# Patient Record
Sex: Male | Born: 1990 | Race: White | Hispanic: No | Marital: Single | Smoking: Never smoker
Health system: Southern US, Community
[De-identification: ages and names within clinical notes are randomized; demographics above are authoritative.]

## PROBLEM LIST (undated history)

## (undated) DIAGNOSIS — N200 Calculus of kidney: Secondary | ICD-10-CM

## (undated) DIAGNOSIS — J45909 Unspecified asthma, uncomplicated: Secondary | ICD-10-CM

## (undated) DIAGNOSIS — T63441A Toxic effect of venom of bees, accidental (unintentional), initial encounter: Secondary | ICD-10-CM

## (undated) DIAGNOSIS — T7840XA Allergy, unspecified, initial encounter: Secondary | ICD-10-CM

## (undated) HISTORY — DX: Unspecified asthma, uncomplicated: J45.909

## (undated) HISTORY — DX: Allergy, unspecified, initial encounter: T78.40XA

## (undated) HISTORY — DX: Calculus of kidney: N20.0

## (undated) HISTORY — DX: Toxic effect of venom of bees, accidental (unintentional), initial encounter: T63.441A

---
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
## 2013-05-01 ENCOUNTER — Emergency Department: Payer: Self-pay | Admitting: Emergency Medicine

## 2014-07-08 ENCOUNTER — Ambulatory Visit: Payer: Self-pay | Admitting: Nurse Practitioner

## 2014-08-03 ENCOUNTER — Encounter: Payer: Self-pay | Admitting: Nurse Practitioner

## 2014-08-03 ENCOUNTER — Ambulatory Visit (INDEPENDENT_AMBULATORY_CARE_PROVIDER_SITE_OTHER): Payer: BLUE CROSS/BLUE SHIELD | Admitting: Nurse Practitioner

## 2014-08-03 ENCOUNTER — Encounter (INDEPENDENT_AMBULATORY_CARE_PROVIDER_SITE_OTHER): Payer: Self-pay

## 2014-08-03 VITALS — BP 104/64 | HR 74 | Temp 97.5°F | Resp 18 | Ht 74.0 in | Wt 157.6 lb

## 2014-08-03 DIAGNOSIS — Z7189 Other specified counseling: Secondary | ICD-10-CM | POA: Diagnosis not present

## 2014-08-03 DIAGNOSIS — Z91048 Other nonmedicinal substance allergy status: Secondary | ICD-10-CM

## 2014-08-03 DIAGNOSIS — Z8709 Personal history of other diseases of the respiratory system: Secondary | ICD-10-CM

## 2014-08-03 DIAGNOSIS — G4700 Insomnia, unspecified: Secondary | ICD-10-CM | POA: Diagnosis not present

## 2014-08-03 DIAGNOSIS — Z9109 Other allergy status, other than to drugs and biological substances: Secondary | ICD-10-CM

## 2014-08-03 DIAGNOSIS — Z7689 Persons encountering health services in other specified circumstances: Secondary | ICD-10-CM

## 2014-08-03 MED ORDER — MOMETASONE FUROATE 50 MCG/ACT NA SUSP: 2.0000 | Freq: Every day | NASAL | Status: DC

## 2014-08-03 NOTE — Patient Instructions (Addendum)
Allergies:   Nasonex-Called into pharmacy [facility]- Allegra/Claritin/Zyrtec (can use generic) to take daily for symptoms Sudafed PE- for decongestant   Insomnia- Try melatonin consistently for a few days to see if it is helpful   Tylenol PM- as needed for sleep  Insomnia Insomnia is frequent trouble falling and/or staying asleep. Insomnia can be a long term problem or a short term problem. Both are common. Insomnia can be a short term problem when the wakefulness is related to a certain stress or worry. Long term insomnia is often related to ongoing stress during waking hours and/or poor sleeping habits. Overtime, sleep deprivation itself can make the problem worse. Every little thing feels more severe because you are overtired and your ability to cope is decreased. CAUSES   Stress, anxiety, and depression.  Poor sleeping habits.  Distractions such as TV in the bedroom.  Naps close to bedtime.  Engaging in emotionally charged conversations before bed.  Technical reading before sleep.  Alcohol and other sedatives. They may make the problem worse. They can hurt normal sleep patterns and normal dream activity.  Stimulants such as caffeine for several hours prior to bedtime.  Pain syndromes and shortness of breath can cause insomnia.  Exercise late at night.  Changing time zones may cause sleeping problems (jet lag). It is sometimes helpful to have someone observe your sleeping patterns. They should look for periods of not breathing during the night (sleep apnea). They should also look to see how long those periods last. If you live alone or observers are uncertain, you can also be observed at a sleep clinic where your sleep patterns will be professionally monitored. Sleep apnea requires a checkup and treatment. Give your caregivers your medical history. Give your caregivers observations your family has made about your sleep.  SYMPTOMS   Not feeling rested in the morning.  Anxiety  and restlessness at bedtime.  Difficulty falling and staying asleep. TREATMENT   Your caregiver may prescribe treatment for an underlying medical disorders. Your caregiver can give advice or help if you are using alcohol or other drugs for self-medication. Treatment of underlying problems will usually eliminate insomnia problems.  Medications can be prescribed for short time use. They are generally not recommended for lengthy use.  Over-the-counter sleep medicines are not recommended for lengthy use. They can be habit forming.  You can promote easier sleeping by making lifestyle changes such as:  Using relaxation techniques that help with breathing and reduce muscle tension.  Exercising earlier in the day.  Changing your diet and the time of your last meal. No night time snacks.  Establish a regular time to go to bed.  Counseling can help with stressful problems and worry.  Soothing music and white noise may be helpful if there are background noises you cannot remove.  Stop tedious detailed work at least one hour before bedtime. HOME CARE INSTRUCTIONS   Keep a diary. Inform your caregiver about your progress. This includes any medication side effects. See your caregiver regularly. Take note of:  Times when you are asleep.  Times when you are awake during the night.  The quality of your sleep.  How you feel the next day. This information will help your caregiver care for you.  Get out of bed if you are still awake after 15 minutes. Read or do some quiet activity. Keep the lights down. Wait until you feel sleepy and go back to bed.  Keep regular sleeping and waking hours. Avoid naps.  Exercise regularly.  Avoid distractions at bedtime. Distractions include watching television or engaging in any intense or detailed activity like attempting to balance the household checkbook.  Develop a bedtime ritual. Keep a familiar routine of bathing, brushing your teeth, climbing into  bed at the same time each night, listening to soothing music. Routines increase the success of falling to sleep faster.  Use relaxation techniques. This can be using breathing and muscle tension release routines. It can also include visualizing peaceful scenes. You can also help control troubling or intruding thoughts by keeping your mind occupied with boring or repetitive thoughts like the old concept of counting sheep. You can make it more creative like imagining planting one beautiful flower after another in your backyard garden.  During your day, work to eliminate stress. When this is not possible use some of the previous suggestions to help reduce the anxiety that accompanies stressful situations. MAKE SURE YOU:   Understand these instructions.  Will watch your condition.  Will get help right away if you are not doing well or get worse. Document Released: 01/21/2000 Document Revised: 04/17/2011 Document Reviewed: 02/20/2007 [facility] Patient Information 2015 [place], [place]. This information is not intended to replace advice given to you by your health care provider. Make sure you discuss any questions you have with your health care provider.

## 2014-08-03 NOTE — Progress Notes (Signed)
Subjective:    Patient ID: Jeffery Cruz, male    DOB: [birth year], 24 y.o.   MRN: [id number]  HPI  Jeffery Cruz is a 24 yo male establishing care and CCs of insomnia and allergies.   1) Health Maintenance-   Diet- Eats out often, dinner at home, not watching anything   Exercise- Active job, no formal exercise   Immunizations- Unknown  Eye Exam- Needs   Dental Exam- UTD  2) Chronic Problems-  Severe allergies- See below  Asthma- Childhood, no inhaler used since then  3) Acute Problems-  Insomnia- x 1-2 years, Grandmother pointed it out- tossed and turned while on vacation, wakes up at 4 am, watches tv before bed, feels physically tired, hard to turn off mind. 20-30 min from lying down to sleep. Sleeps for 4 hours then awake for 20-30 min. Wakes up feeling tired.   Melatonin- somewhat helpful  Diversion of mind with reading    Allergies- Trouble breathing out of nose, progressing over the summer, taste and smell are decreased since summer time started,   Z-pack- took full five days (brother's prescription), helpful, Took it 2 months ago.    No allergy medications   Nasal spray- unsure of what it is.   Review of Systems  Constitutional: Negative for fever, chills, diaphoresis and fatigue.  HENT: Positive for congestion, nosebleeds, rhinorrhea, sinus pressure and sneezing. Negative for ear discharge, ear pain, postnasal drip and sore throat.        Nosebleeds- 2 x in last month  Eyes: Positive for discharge and itching. Negative for photophobia and visual disturbance.  Respiratory: Negative for cough, chest tightness, shortness of breath and wheezing.   Cardiovascular: Negative for chest pain, palpitations and leg swelling.  Gastrointestinal: Negative for nausea, vomiting, diarrhea and constipation.  Genitourinary: Negative for difficulty urinating.  Musculoskeletal: Negative for myalgias, joint swelling, gait problem and neck stiffness.  Skin: Negative for rash.    Neurological: Positive for headaches. Negative for dizziness, weakness and numbness.  Psychiatric/Behavioral: Positive for sleep disturbance. Negative for suicidal ideas. The patient is not nervous/anxious.    Past Medical History  Diagnosis Date  . Allergy   . Asthma     History   Social History  . Marital Status: Single    Spouse Name: N/A  . Number of Children: N/A  . Years of Education: N/A   Occupational History  . Not on file.   Social History Main Topics  . Smoking status: Never Smoker   . Smokeless tobacco: Not on file  . Alcohol Use: No  . Drug Use: No  . Sexual Activity:    Partners: Female     Comment: 1 partner    Other Topics Concern  . Not on file   Social History Narrative   Work- [employer]    Lives by himself    No children    Left handed    Caffeine- None   Enjoys- Works around his house    Highest level of education- high school diploma     Past Surgical History  Procedure Laterality Date  . Wisdom tooth extraction Bilateral 2013     Family History  Problem Relation Age of Onset  . Allergies Father   . Allergies Sister   . Arthritis Maternal Grandmother   . Arthritis Paternal Grandmother     No Known Allergies  No current outpatient prescriptions on file prior to visit.   No current facility-administered medications on file prior  to visit.      Objective:   Physical Exam  Constitutional: He is oriented to person, place, and time. He appears well-developed and well-nourished. No distress.  BP 104/64 mmHg  Pulse 74  Temp(Src) 97.5 F (36.4 C)  Resp 18  Ht 6\' 2"  (1.88 m)  Wt 157 lb 9.6 oz (71.487 kg)  BMI 20.23 kg/m2  SpO2 97%   HENT:  Head: Normocephalic and atraumatic.  Right Ear: External ear normal.  Left Ear: External ear normal.  Cardiovascular: Normal rate, regular rhythm and normal heart sounds.  Exam reveals no gallop and no friction rub.   No murmur heard. Pulmonary/Chest: Effort normal  and breath sounds normal. No respiratory distress. He has no wheezes. He has no rales. He exhibits no tenderness.  Neurological: He is alert and oriented to person, place, and time.  Skin: Skin is warm and dry. No rash noted. He is not diaphoretic.  Psychiatric: He has a normal mood and affect. His behavior is normal. Judgment and thought content normal.       Assessment & Plan:

## 2014-08-11 DIAGNOSIS — Z9109 Other allergy status, other than to drugs and biological substances: Secondary | ICD-10-CM | POA: Insufficient documentation

## 2014-08-11 DIAGNOSIS — G4700 Insomnia, unspecified: Secondary | ICD-10-CM | POA: Insufficient documentation

## 2014-08-11 DIAGNOSIS — Z7689 Persons encountering health services in other specified circumstances: Secondary | ICD-10-CM | POA: Insufficient documentation

## 2014-08-11 DIAGNOSIS — Z8709 Personal history of other diseases of the respiratory system: Secondary | ICD-10-CM | POA: Insufficient documentation

## 2014-08-11 NOTE — Assessment & Plan Note (Signed)
Discussed acute and chronic issues. Reviewed health maintenance measures, PFSHx, and immunizations. Obtain records from previous facilities.

## 2014-08-11 NOTE — Assessment & Plan Note (Addendum)
1-2 years worsening recently. Pt using some sleep hygiene techniques and OTC medications such as melatonin. Not taking consistently. Asked him to try it consistently for a few weeks. FU 4 weeks

## 2014-08-11 NOTE — Assessment & Plan Note (Addendum)
Called in refills of nasonex. Asked pt to start Allegra/Claritin/or Zyrtec (pick one). FU in 4 wks

## 2014-08-11 NOTE — Assessment & Plan Note (Signed)
No problems since childhood, no inhaler, currently stable

## 2014-08-31 ENCOUNTER — Ambulatory Visit (INDEPENDENT_AMBULATORY_CARE_PROVIDER_SITE_OTHER): Payer: BLUE CROSS/BLUE SHIELD | Admitting: Nurse Practitioner

## 2014-08-31 VITALS — BP 98/76 | HR 72 | Temp 98.0°F | Resp 18 | Ht 74.0 in | Wt 159.6 lb

## 2014-08-31 DIAGNOSIS — M25512 Pain in left shoulder: Secondary | ICD-10-CM

## 2014-08-31 DIAGNOSIS — G4700 Insomnia, unspecified: Secondary | ICD-10-CM

## 2014-08-31 DIAGNOSIS — Z9109 Other allergy status, other than to drugs and biological substances: Secondary | ICD-10-CM

## 2014-08-31 DIAGNOSIS — Z91048 Other nonmedicinal substance allergy status: Secondary | ICD-10-CM

## 2014-08-31 NOTE — Patient Instructions (Signed)
Try a tennis ball between your shoulders and roll it between you and the wall.  Muscle relaxer as needed (only at bedtime)  Ice and ibuprofen are helpful as well.   Melatonin is over the counter and you can find anywhere there are vitamins.

## 2014-08-31 NOTE — Progress Notes (Signed)
   Subjective:    Patient ID: Jeffery Cruz, male    DOB: Nov 22, 1990, 24 y.o.   MRN: [id number]  HPI  Mr. Jeffery Cruz is a 24 yo male with a follow up of insomnia and allergies.   1) Melatonin- not taken, no problems going to sleep he reports, but wakes up 2-3 x and has trouble falling asleep. Not getting good quality sleep.   2) Allergies- Nasonex and Zyrtec makes his nose sore, allergies have improved. It is helpful   3) L shoulder x 1 year and right x 1 month. Lifts furniture daily. Injured back last year- muscle strain and gave him muscle relaxer.    Review of Systems  Constitutional: Positive for fatigue. Negative for fever, chills and diaphoresis.  Eyes: Negative for visual disturbance.  Respiratory: Negative for cough, chest tightness, shortness of breath and wheezing.   Cardiovascular: Negative for chest pain, palpitations and leg swelling.  Allergic/Immunologic: Positive for environmental allergies.  Psychiatric/Behavioral: Positive for sleep disturbance. Negative for suicidal ideas. The patient is not nervous/anxious.       Objective:   Physical Exam  Constitutional: He is oriented to person, place, and time. He appears well-developed and well-nourished. No distress.  BP 98/76 mmHg  Pulse 72  Temp(Src) 98 F (36.7 C)  Resp 18  Ht 6\' 2"  (1.88 m)  Wt 159 lb 9.6 oz (72.394 kg)  BMI 20.48 kg/m2  SpO2 97%   HENT:  Head: Normocephalic and atraumatic.  Right Ear: External ear normal.  Left Ear: External ear normal.  Cardiovascular: Normal rate, regular rhythm and normal heart sounds.   Pulmonary/Chest: Effort normal and breath sounds normal. No respiratory distress. He has no wheezes. He has no rales. He exhibits no tenderness.  Musculoskeletal: Normal range of motion. He exhibits tenderness. He exhibits no edema.  Neurological: He is alert and oriented to person, place, and time.  Skin: Skin is warm and dry. No rash noted. He is not diaphoretic.  Psychiatric: He has a  normal mood and affect. His behavior is normal. Judgment and thought content normal.      Assessment & Plan:

## 2014-08-31 NOTE — Progress Notes (Signed)
Pre visit review using our clinic review tool, if applicable. No additional management support is needed unless otherwise documented below in the visit note. 

## 2014-09-09 ENCOUNTER — Encounter: Payer: Self-pay | Admitting: Nurse Practitioner

## 2014-09-09 DIAGNOSIS — M25512 Pain in left shoulder: Secondary | ICD-10-CM | POA: Insufficient documentation

## 2014-09-09 NOTE — Assessment & Plan Note (Signed)
Encouraged pt to try cross over technique instead of spraying straight up nostrils. FU prn worsening/failure to improve.

## 2014-09-09 NOTE — Assessment & Plan Note (Signed)
Pt has been non-compliant on the melatonin. He reports same symptoms, but was unsure if it was prescription or OTC (advised pt to call if he has questions like this instead of waiting). OTC melatonin consistently. Advised of sleep hygiene pointers.

## 2014-09-09 NOTE — Assessment & Plan Note (Signed)
Worsening. Pt has leftover muscle relaxer at home and will use. Described stretches for pt to perform, ice, rest, and trying a tennis ball between shoulder blades and rolling it against a wall and his back. FU prn worsening/failure to improve.

## 2014-10-10 ENCOUNTER — Encounter: Payer: Self-pay | Admitting: Emergency Medicine

## 2014-10-10 ENCOUNTER — Emergency Department
Admission: EM | Admit: 2014-10-10 | Discharge: 2014-10-10 | Disposition: A | Discharge status: Home / Self Care | Payer: BLUE CROSS/BLUE SHIELD | Attending: Emergency Medicine | Admitting: Emergency Medicine

## 2014-10-10 DIAGNOSIS — S76211A Strain of adductor muscle, fascia and tendon of right thigh, initial encounter: Secondary | ICD-10-CM | POA: Diagnosis not present

## 2014-10-10 DIAGNOSIS — Y998 Other external cause status: Secondary | ICD-10-CM | POA: Diagnosis not present

## 2014-10-10 DIAGNOSIS — S76219A Strain of adductor muscle, fascia and tendon of unspecified thigh, initial encounter: Secondary | ICD-10-CM

## 2014-10-10 DIAGNOSIS — Y9289 Other specified places as the place of occurrence of the external cause: Secondary | ICD-10-CM | POA: Insufficient documentation

## 2014-10-10 DIAGNOSIS — Y9389 Activity, other specified: Secondary | ICD-10-CM | POA: Insufficient documentation

## 2014-10-10 DIAGNOSIS — S3991XA Unspecified injury of abdomen, initial encounter: Secondary | ICD-10-CM | POA: Diagnosis present

## 2014-10-10 DIAGNOSIS — X58XXXA Exposure to other specified factors, initial encounter: Secondary | ICD-10-CM | POA: Diagnosis not present

## 2014-10-10 LAB — URINALYSIS COMPLETE WITH MICROSCOPIC (ARMC ONLY)
BACTERIA UA: NONE SEEN
Bilirubin Urine: NEGATIVE
GLUCOSE, UA: NEGATIVE mg/dL
HGB URINE DIPSTICK: NEGATIVE
Ketones, ur: NEGATIVE mg/dL
LEUKOCYTES UA: NEGATIVE
Nitrite: NEGATIVE
PH: 7 (ref 5.0–8.0)
PROTEIN: NEGATIVE mg/dL
SQUAMOUS EPITHELIAL / LPF: NONE SEEN
Specific Gravity, Urine: 1.01 (ref 1.005–1.030)

## 2014-10-10 LAB — CBC
HEMATOCRIT: 41.8 % (ref 40.0–52.0)
Hemoglobin: 14.4 g/dL (ref 13.0–18.0)
MCH: 31.1 pg (ref 26.0–34.0)
MCHC: 34.5 g/dL (ref 32.0–36.0)
MCV: 90.3 fL (ref 80.0–100.0)
Platelets: 242 10*3/uL (ref 150–440)
RBC: 4.63 MIL/uL (ref 4.40–5.90)
RDW: 12.6 % (ref 11.5–14.5)
WBC: 7 10*3/uL (ref 3.8–10.6)

## 2014-10-10 LAB — COMPREHENSIVE METABOLIC PANEL
ALBUMIN: 4.6 g/dL (ref 3.5–5.0)
ALT: 17 U/L (ref 17–63)
AST: 24 U/L (ref 15–41)
Alkaline Phosphatase: 61 U/L (ref 38–126)
Anion gap: 8 (ref 5–15)
BUN: 13 mg/dL (ref 6–20)
CHLORIDE: 102 mmol/L (ref 101–111)
CO2: 29 mmol/L (ref 22–32)
Calcium: 9.1 mg/dL (ref 8.9–10.3)
Creatinine, Ser: 0.89 mg/dL (ref 0.61–1.24)
GFR calc Af Amer: >60 mL/min (ref >60)
GLUCOSE: 113 mg/dL — AB (ref 65–99)
POTASSIUM: 3.6 mmol/L (ref 3.5–5.1)
SODIUM: 139 mmol/L (ref 135–145)
Total Bilirubin: 1.1 mg/dL (ref 0.3–1.2)
Total Protein: 7 g/dL (ref 6.5–8.1)

## 2014-10-10 LAB — LIPASE, BLOOD: LIPASE: 23 U/L (ref 22–51)

## 2014-10-10 MED ORDER — IBUPROFEN 800 MG PO TABS: 800.0000 mg | ORAL_TABLET | Freq: Three times a day (TID) | ORAL | Status: DC | PRN

## 2014-10-10 MED ORDER — IBUPROFEN 800 MG PO TABS
800.0000 mg | ORAL_TABLET | Freq: Once | ORAL | Status: AC
  Administered 2014-10-10: 800 mg via ORAL
  Filled 2014-10-10: qty 1

## 2014-10-10 MED ORDER — DIAZEPAM 5 MG PO TABS: 5.0000 mg | ORAL_TABLET | Freq: Three times a day (TID) | ORAL | Status: DC | PRN

## 2014-10-10 MED ORDER — DIAZEPAM 5 MG PO TABS
10.0000 mg | ORAL_TABLET | Freq: Once | ORAL | Status: AC
  Administered 2014-10-10: 10 mg via ORAL
  Filled 2014-10-10: qty 2

## 2014-10-10 NOTE — ED Notes (Signed)
Pt reports right lower quadrant pain x2-3 days ago; pt denies nausea or vomiting, reports diarrhea x2 days. Pt denies blood.

## 2014-10-10 NOTE — Discharge Instructions (Signed)
Groin Strain °A groin strain (also called a groin pull) is an injury to the muscles or tendon on the upper inner part of the thigh. These muscles are called the adductor muscles or groin muscles. They are responsible for moving the leg across the body. A muscle strain occurs when a muscle is overstretched and some muscle fibers are torn. A groin strain can range from mild to severe depending on how many muscle fibers are affected and whether the muscle fibers are partially or completely torn.  °Groin strains usually occur during exercise or participation in sports. The injury often happens when a sudden, violent force is placed on a muscle, stretching the muscle too far. A strain is more likely to occur when your muscles are not warmed up or if you are not properly conditioned. Depending on the severity of the groin strain, recovery time may vary from a few weeks to several weeks. Severe injuries often require 4-6 weeks for recovery. In these cases, complete healing can take 4-5 months.  °CAUSES  °· Stretching the groin muscles too far or too suddenly, often during side-to-side motion with an abrupt change in direction. °· Putting repeated stress on the groin muscles over a long period of time. °· Performing vigorous activity without properly stretching the groin muscles beforehand. °SYMPTOMS  °· Pain and tenderness in the groin area. This begins as sharp pain and persists as a dull ache. °· Popping or snapping feeling when the injury occurs (for severe strains). °· Swelling or bruising. °· Muscle spasms. °· Weakness in the leg. °· Stiffness in the groin area with decreased ability to move the affected muscles. °DIAGNOSIS  °Your caregiver will perform a physical exam to diagnose a groin strain. You will be asked about your symptoms and how the injury occurred. X-rays are sometimes needed to rule out a broken bone or cartilage problems. Your caregiver may order a CT scan or MRI if a complete muscle tear is  suspected. °TREATMENT  °A groin strain will often heal on its own. Your caregiver may prescribe medicines to help manage pain and swelling (anti-inflammatory medicine). You may be told to use crutches for the first few days to minimize your pain. °HOME CARE INSTRUCTIONS  °· Rest. Do not use the strained muscle if it causes pain. °· Put ice on the injured area. °¨ Put ice in a plastic bag. °¨ Place a towel between your skin and the bag. °¨ Leave the ice on for 15-20 minutes, every 2-3 hours. Do this for the first 2 days after the injury.  °· Only take over-the-counter or prescription medicines as directed by your caregiver. °· Wrap the injured area with an elastic bandage as directed by your caregiver. °· Keep the injured leg raised (elevated). °· Walk, stretch, and perform range-of-motion exercises to improve blood flow to the injured area. Only perform these activities if you can do so without any pain. °To prevent muscle strains: °· Warm up before exercise. °· Develop proper conditioning and strength in the groin muscles. °SEEK IMMEDIATE MEDICAL CARE IF:  °· You have increased pain or swelling in the affected area.   °· Your symptoms are not improving or are getting worse. °MAKE SURE YOU:  °· Understand these instructions. °· Will watch your condition. °· Will get help right away if you are not doing well or get worse. °Document Released: 09/21/2003 Document Revised: 01/10/2012 Document Reviewed: 09/27/2011 °[employer]® Patient Information ©2015 [employer]. This information is not intended to replace advice given to you   by your health care provider. Make sure you discuss any questions you have with your health care provider. ° °

## 2014-10-10 NOTE — ED Provider Notes (Signed)
[facility] Emergency Department Provider Note     Time seen: ----------------------------------------- 9:46 PM on 10/10/2014 -----------------------------------------    I have reviewed the triage vital signs and the nursing notes.   HISTORY  Chief Complaint Abdominal Pain    HPI Jeffery Cruz is a 24 y.o. male who presents to ER with right lower quadrant pain for the last 2-3 days. Patient denies any nausea vomiting but has had some diarrhea for 2 days. Patient denies any fevers chills other complaints. Patient currently works Building surveyor.Moving the right leg seems to worsen his symptoms.   Past Medical History  Diagnosis Date  . Allergy   . Asthma     Patient Active Problem List   Diagnosis Date Noted  . Left shoulder pain 09/09/2014  . Encounter to establish care 08/11/2014  . Insomnia 08/11/2014  . H/O extrinsic asthma 08/11/2014  . Environmental allergies 08/11/2014    Past Surgical History  Procedure Laterality Date  . Wisdom tooth extraction Bilateral 2013     Allergies Review of patient's allergies indicates no known allergies.  Social History Social History  Substance Use Topics  . Smoking status: Never Smoker   . Smokeless tobacco: None  . Alcohol Use: No    Review of Systems Constitutional: Negative for fever. Eyes: Negative for visual changes. ENT: Negative for sore throat. Cardiovascular: Negative for chest pain. Respiratory: Negative for shortness of breath. Gastrointestinal: Negative for abdominal pain, vomiting and diarrhea. Genitourinary: Negative for dysuria. Musculoskeletal: Positive for right groin pain Skin: Negative for rash. Neurological: Negative for headaches, focal weakness or numbness.  10-point ROS otherwise negative.  ____________________________________________   PHYSICAL EXAM:  VITAL SIGNS: ED Triage Vitals  Enc Vitals Group     BP 10/10/14 2117 119/68 mmHg     Pulse Rate  10/10/14 2117 70     Resp 10/10/14 2117 16     Temp 10/10/14 2117 98.5 F (36.9 C)     Temp Source 10/10/14 2117 Oral     SpO2 10/10/14 2117 97 %     Weight 10/10/14 2117 155 lb (70.308 kg)     Height 10/10/14 2117 6\' 2"  (1.88 m)     Head Cir --      Peak Flow --      Pain Score 10/10/14 2117 6     Pain Loc --      Pain Edu? --      Excl. in [place]? --     Constitutional: Alert and oriented. Well appearing and in no distress. Eyes: Conjunctivae are normal. PERRL. Normal extraocular movements. ENT   Head: Normocephalic and atraumatic.   Nose: No congestion/rhinnorhea.   Mouth/Throat: Mucous membranes are moist.   Neck: No stridor. Cardiovascular: Normal rate, regular rhythm. Normal and symmetric distal pulses are present in all extremities. No murmurs, rubs, or gallops. Respiratory: Normal respiratory effort without tachypnea nor retractions. Breath sounds are clear and equal bilaterally. No wheezes/rales/rhonchi. Gastrointestinal: Soft and nontender. No distention. No abdominal bruits.  Musculoskeletal: Patient has tenderness over the anterior superior iliac spine, the insertion of the sartorius muscle. This is reproducibly tender. He has pain with range of motion of his right hip particularly external rotation and abduction Neurologic:  Normal speech and language. No gross focal neurologic deficits are appreciated. Speech is normal. No gait instability. Skin:  Skin is warm, dry and intact. No rash noted. Psychiatric: Mood and affect are normal. Speech and behavior are normal. Patient exhibits appropriate insight and judgment.  ____________________________________________  ED COURSE:  Pertinent labs & imaging results that were available during my care of the patient were reviewed by me and considered in my medical decision making (see chart for details). Patient with essentially a groin muscle strain. ____________________________________________    LABS (pertinent  positives/negatives)  Labs Reviewed  COMPREHENSIVE METABOLIC PANEL - Abnormal; Notable for the following:    Glucose, Bld 113 (*)    All other components within normal limits  URINALYSIS COMPLETEWITH MICROSCOPIC ([facility] ONLY) - Abnormal; Notable for the following:    Color, Urine YELLOW (*)    APPearance HAZY (*)    All other components within normal limits  LIPASE, BLOOD  CBC    ____________________________________________  FINAL ASSESSMENT AND PLAN  Groin strain  Plan: Patient with labs and imaging as dictated above. Patient will groin muscle strain. Being given Motrin and Valium to take here. He is stable for outpatient follow-up.   Earleen Newport, MD   Earleen Newport, MD 10/10/14 2242

## 2014-11-02 ENCOUNTER — Ambulatory Visit: Payer: Self-pay | Admitting: Nurse Practitioner

## 2015-01-11 ENCOUNTER — Ambulatory Visit (INDEPENDENT_AMBULATORY_CARE_PROVIDER_SITE_OTHER): Payer: BLUE CROSS/BLUE SHIELD | Admitting: Family Medicine

## 2015-01-11 ENCOUNTER — Encounter: Payer: Self-pay | Admitting: Family Medicine

## 2015-01-11 VITALS — BP 122/82 | HR 71 | Temp 98.4°F | Ht 74.0 in | Wt 148.5 lb

## 2015-01-11 DIAGNOSIS — M546 Pain in thoracic spine: Secondary | ICD-10-CM | POA: Diagnosis not present

## 2015-01-11 DIAGNOSIS — M545 Low back pain: Secondary | ICD-10-CM | POA: Diagnosis not present

## 2015-01-11 NOTE — Patient Instructions (Addendum)
It was nice to see you today.  Be sure to get your xrays at the [facility] office.  Use Ibuprofen 800 mg three times daily as needed.   Follow up:  Return if symptoms worsen or fail to improve.  Take care  Dr. Lacinda Axon

## 2015-01-11 NOTE — Assessment & Plan Note (Signed)
New problem. Appears to be MSK in nature. 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 Given duration of illness will obtain x-ray to ensure no underlying fracture or other acute pathology. Advised over-the-counter ibuprofen as needed for pain

## 2015-01-11 NOTE — Progress Notes (Signed)
Subjective:  Patient ID: Jeffery Cruz, male    DOB: 05-17-1990  Age: 24 y.o. MRN: [id number]  CC: Back pain  HPI:  24 year old male presents today for evaluation of back pain.  Back pain  Patient reports he has a history of low back pain but has had new thoracic back pain/periscapular pain for the past 3-4 months.  Pain is located around the scapula and then bilaterally as well as midline.  Pain is mild to moderate in severity.  Seems to be exacerbated by physical activity. 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  No relieving factors. He is not taking any medications for this.  He also continues to have intermittent low back pain without radiculopathy.  No other associated symptoms: Fever, chills, weight loss, incontinence.  Social Hx   Social History   Social History  . Marital Status: Single    Spouse Name: N/A  . Number of Children: N/A  . Years of Education: N/A   Social History Main Topics  . Smoking status: Never Smoker   . Smokeless tobacco: None  . Alcohol Use: No  . Drug Use: No  . Sexual Activity:    Partners: Female     Comment: 1 partner    Other Topics Concern  . None   Social History Narrative   Work- Holiday representative- [employer]    Lives by himself    No children    Left handed    Caffeine- None   Enjoys- Works around his house    Highest level of education- high school diploma    Review of Systems  Constitutional: Negative.   Musculoskeletal: Positive for back pain.    Objective:  BP 122/82 mmHg  Pulse 71  Temp(Src) 98.4 F (36.9 C) (Oral)  Ht 6\' 2"  (1.88 m)  Wt 148 lb 8 oz (67.359 kg)  BMI 19.06 kg/m2  SpO2 98%  BP/Weight 01/11/2015 10/10/2014 [id number]  Systolic BP 123XX123 [id number] 98  Diastolic BP 82 68 76  Wt. (Lbs) 148.5 155 159.6  BMI 19.06 19.89 20.48   Physical Exam  Constitutional: He is oriented to person, place, and time. He appears well-developed. No distress.  HENT:  Head: Normocephalic and  atraumatic.  Cardiovascular: Normal rate and regular rhythm.   No murmur heard. Pulmonary/Chest: Effort normal and breath sounds normal.  Musculoskeletal:  Thoracic spine - patient has some ropey musculature around the scapula particularly the left. No spinal tenderness. Normal kyphosis.  Neurological: He is alert and oriented to person, place, and time.  Psychiatric: He has a normal mood and affect.  Vitals reviewed.  Lab Results  Component Value Date   WBC 7.0 10/10/2014   HGB 14.4 10/10/2014   HCT 41.8 10/10/2014   PLT 242 10/10/2014   GLUCOSE 113* 10/10/2014   ALT 17 10/10/2014   AST 24 10/10/2014   NA 139 10/10/2014   K 3.6 10/10/2014   CL 102 10/10/2014   CREATININE 0.89 10/10/2014   BUN 13 10/10/2014   CO2 29 10/10/2014    Assessment & Plan:   Problem List Items Addressed This Visit    Bilateral thoracic back pain - Primary    New problem. Appears to be MSK in nature. 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 Given duration of illness will obtain x-ray to ensure no underlying fracture or other acute pathology. Advised over-the-counter ibuprofen as needed for pain      Relevant Orders  DG Thoracic Spine 2 View    Other Visit Diagnoses    Low back pain without sciatica, unspecified back pain laterality        Relevant Orders    DG Lumbar Spine 2-3 Views       Follow-up: Return if symptoms worsen or fail to improve.  [place]

## 2015-01-11 NOTE — Progress Notes (Signed)
Pre visit review using our clinic review tool, if applicable. No additional management support is needed unless otherwise documented below in the visit note. 

## 2016-07-26 DIAGNOSIS — M545 Low back pain: Secondary | ICD-10-CM | POA: Diagnosis not present

## 2017-04-12 ENCOUNTER — Other Ambulatory Visit: Payer: Self-pay

## 2017-04-12 ENCOUNTER — Emergency Department: Payer: BLUE CROSS/BLUE SHIELD

## 2017-04-12 ENCOUNTER — Emergency Department
Admission: EM | Admit: 2017-04-12 | Discharge: 2017-04-12 | Disposition: A | Discharge status: Home / Self Care | Payer: BLUE CROSS/BLUE SHIELD | Attending: Student in an Organized Health Care Education/Training Program | Admitting: Student in an Organized Health Care Education/Training Program

## 2017-04-12 DIAGNOSIS — R319 Hematuria, unspecified: Secondary | ICD-10-CM | POA: Insufficient documentation

## 2017-04-12 DIAGNOSIS — N200 Calculus of kidney: Secondary | ICD-10-CM | POA: Insufficient documentation

## 2017-04-12 DIAGNOSIS — R310 Gross hematuria: Secondary | ICD-10-CM | POA: Diagnosis not present

## 2017-04-12 DIAGNOSIS — R1032 Left lower quadrant pain: Secondary | ICD-10-CM | POA: Diagnosis not present

## 2017-04-12 DIAGNOSIS — Z79899 Other long term (current) drug therapy: Secondary | ICD-10-CM | POA: Diagnosis not present

## 2017-04-12 DIAGNOSIS — J45909 Unspecified asthma, uncomplicated: Secondary | ICD-10-CM | POA: Diagnosis not present

## 2017-04-12 DIAGNOSIS — R109 Unspecified abdominal pain: Secondary | ICD-10-CM | POA: Diagnosis not present

## 2017-04-12 LAB — COMPREHENSIVE METABOLIC PANEL
ALT: 38 U/L (ref 17–63)
AST: 32 U/L (ref 15–41)
Albumin: 4.6 g/dL (ref 3.5–5.0)
Alkaline Phosphatase: 70 U/L (ref 38–126)
Anion gap: 9 (ref 5–15)
BUN: 14 mg/dL (ref 6–20)
CHLORIDE: 102 mmol/L (ref 101–111)
CO2: 28 mmol/L (ref 22–32)
Calcium: 9 mg/dL (ref 8.9–10.3)
Creatinine, Ser: 0.85 mg/dL (ref 0.61–1.24)
Glucose, Bld: 93 mg/dL (ref 65–99)
POTASSIUM: 4.2 mmol/L (ref 3.5–5.1)
Sodium: 139 mmol/L (ref 135–145)
Total Bilirubin: 1.4 mg/dL — ABNORMAL HIGH (ref 0.3–1.2)
Total Protein: 7.4 g/dL (ref 6.5–8.1)

## 2017-04-12 LAB — URINALYSIS, COMPLETE (UACMP) WITH MICROSCOPIC
BACTERIA UA: NONE SEEN
BILIRUBIN URINE: NEGATIVE
Glucose, UA: NEGATIVE mg/dL
KETONES UR: NEGATIVE mg/dL
LEUKOCYTES UA: NEGATIVE
NITRITE: NEGATIVE
Protein, ur: 30 mg/dL — AB
SPECIFIC GRAVITY, URINE: 1.018 (ref 1.005–1.030)
Squamous Epithelial / LPF: NONE SEEN
WBC UA: NONE SEEN WBC/hpf (ref 0–5)
pH: 6 (ref 5.0–8.0)

## 2017-04-12 LAB — CBC
HCT: 43.6 % (ref 40.0–52.0)
Hemoglobin: 14.7 g/dL (ref 13.0–18.0)
MCH: 30.6 pg (ref 26.0–34.0)
MCHC: 33.7 g/dL (ref 32.0–36.0)
MCV: 90.6 fL (ref 80.0–100.0)
PLATELETS: 270 10*3/uL (ref 150–440)
RBC: 4.81 MIL/uL (ref 4.40–5.90)
RDW: 12.7 % (ref 11.5–14.5)
WBC: 5.2 10*3/uL (ref 3.8–10.6)

## 2017-04-12 MED ORDER — HYDROCODONE-ACETAMINOPHEN 5-325 MG PO TABS: 1.0000 | ORAL_TABLET | ORAL | 0 refills | Status: DC | PRN

## 2017-04-12 MED ORDER — ONDANSETRON HCL 4 MG/2ML IJ SOLN
4.0000 mg | Freq: Once | INTRAMUSCULAR | Status: AC
  Administered 2017-04-12: 4 mg via INTRAVENOUS

## 2017-04-12 MED ORDER — ONDANSETRON HCL 4 MG/2ML IJ SOLN
INTRAMUSCULAR | Status: AC
  Administered 2017-04-12: 4 mg via INTRAVENOUS
  Filled 2017-04-12: qty 2

## 2017-04-12 MED ORDER — OXYCODONE-ACETAMINOPHEN 5-325 MG PO TABS
ORAL_TABLET | ORAL | Status: AC
  Filled 2017-04-12: qty 1

## 2017-04-12 MED ORDER — MORPHINE SULFATE (PF) 4 MG/ML IV SOLN
4.0000 mg | INTRAVENOUS | Status: DC | PRN
  Administered 2017-04-12 (×2): 4 mg via INTRAVENOUS
  Filled 2017-04-12 (×2): qty 1

## 2017-04-12 MED ORDER — SODIUM CHLORIDE 0.9 % IV BOLUS (SEPSIS)
1000.0000 mL | Freq: Once | INTRAVENOUS | Status: AC
  Administered 2017-04-12: 1000 mL via INTRAVENOUS

## 2017-04-12 MED ORDER — NAPROXEN 500 MG PO TABS: 500.0000 mg | ORAL_TABLET | Freq: Two times a day (BID) | ORAL | 0 refills | Status: DC

## 2017-04-12 MED ORDER — KETOROLAC TROMETHAMINE 30 MG/ML IJ SOLN
15.0000 mg | Freq: Once | INTRAMUSCULAR | Status: AC
  Administered 2017-04-12: 15 mg via INTRAVENOUS
  Filled 2017-04-12: qty 1

## 2017-04-12 MED ORDER — PROMETHAZINE HCL 25 MG/ML IJ SOLN: 12.5000 mg | Freq: Four times a day (QID) | INTRAMUSCULAR | Status: DC | PRN

## 2017-04-12 MED ORDER — OXYCODONE-ACETAMINOPHEN 5-325 MG PO TABS
1.0000 | ORAL_TABLET | ORAL | Status: DC | PRN
  Administered 2017-04-12: 1 via ORAL
  Filled 2017-04-12: qty 1

## 2017-04-12 MED ORDER — ONDANSETRON HCL 4 MG PO TABS: 4.0000 mg | ORAL_TABLET | Freq: Every day | ORAL | 0 refills | Status: DC | PRN

## 2017-04-12 NOTE — ED Triage Notes (Signed)
Pt c/o lower bad pain and urinating blood that started this am

## 2017-04-12 NOTE — ED Provider Notes (Signed)
[facility] Emergency Department Provider Note    None    (approximate)  I have reviewed the triage vital signs and the nursing notes.   HISTORY  Chief Complaint Hematuria and Abdominal Pain    HPI Jeffery Cruz is a 27 y.o. male no significant past medical history presents with left lower quadrant abdominal pain as well as hematuria.  Sepsis in a acute pain radiates into his left groin.  Is never had pain like this before.  States is colicky moderate to severe.  No fevers.  States he does have a history of urinary tract infections but denies any burning when he urinates.  He has not taken anything for the pain.  No pain in his left testicle.  No nausea or vomiting.  Past Medical History:  Diagnosis Date  . Allergy   . Asthma    Family History  Problem Relation Age of Onset  . Allergies Father   . Allergies Sister   . Arthritis Maternal Grandmother   . Arthritis Paternal Grandmother    Past Surgical History:  Procedure Laterality Date  . WISDOM TOOTH EXTRACTION Bilateral 2013    Patient Active Problem List   Diagnosis Date Noted  . Bilateral thoracic back pain 01/11/2015  . Left shoulder pain 09/09/2014  . Encounter to establish care 08/11/2014  . Insomnia 08/11/2014  . H/O extrinsic asthma 08/11/2014  . Environmental allergies 08/11/2014      Prior to Admission medications   Medication Sig Start Date End Date Taking? Authorizing Provider  cetirizine (ZYRTEC) 10 MG tablet Take 10 mg by mouth daily.    [provider]  diazepam (VALIUM) 5 MG tablet Take 1 tablet (5 mg total) by mouth every 8 (eight) hours as needed for muscle spasms. Patient not taking: Reported on 01/11/2015 10/10/14   Earleen Newport, MD  HYDROcodone-acetaminophen [facility]) 5-325 MG tablet Take 1 tablet by mouth every 4 (four) hours as needed for moderate pain. 04/12/17   Merlyn Lot, MD  ibuprofen (ADVIL,MOTRIN) 800 MG tablet Take 1 tablet (800 mg  total) by mouth every 8 (eight) hours as needed. Patient not taking: Reported on 01/11/2015 10/10/14   Earleen Newport, MD  mometasone (NASONEX) 50 MCG/ACT nasal spray Place 2 sprays into the nose daily. 08/03/14   Rubbie Battiest, RN  naproxen (NAPROSYN) 500 MG tablet Take 1 tablet (500 mg total) by mouth 2 (two) times daily with a meal. 04/12/17 04/12/18  Merlyn Lot, MD  ondansetron (ZOFRAN) 4 MG tablet Take 1 tablet (4 mg total) by mouth daily as needed for nausea or vomiting. 04/12/17 04/12/18  Merlyn Lot, MD    Allergies Patient has no known allergies.    Social History Social History   Tobacco Use  . Smoking status: Never Smoker  . Smokeless tobacco: Never Used  Substance Use Topics  . Alcohol use: No    Alcohol/week: 0.0 oz  . Drug use: No    Review of Systems Patient denies headaches, rhinorrhea, blurry vision, numbness, shortness of breath, chest pain, edema, cough, abdominal pain, nausea, vomiting, diarrhea, dysuria, fevers, rashes or hallucinations unless otherwise stated above in HPI. ____________________________________________   PHYSICAL EXAM:  VITAL SIGNS: Vitals:   04/12/17 1635 04/12/17 1718  BP: 107/74 100/60  Pulse: 74 70  Resp: 18 14  Temp:    SpO2: 100% 100%    Constitutional: Alert and oriented. Well appearing and in no acute distress. Eyes: Conjunctivae are normal.  Head: Atraumatic. Nose: No  congestion/rhinnorhea. Mouth/Throat: Mucous membranes are moist.   Neck: No stridor. Painless ROM.  Cardiovascular: Normal rate, regular rhythm. Grossly normal heart sounds.  Good peripheral circulation. Respiratory: Normal respiratory effort.  No retractions. Lungs CTAB. Gastrointestinal: Soft and nontender. No distention. No abdominal bruits. No CVA tenderness. Genitourinary: deferred Musculoskeletal: No lower extremity tenderness nor edema.  No joint effusions. Neurologic:  Normal speech and language. No gross focal neurologic deficits are  appreciated. No facial droop Skin:  Skin is warm, dry and intact. No rash noted. Psychiatric: Mood and affect are normal. Speech and behavior are normal.  ____________________________________________   LABS (all labs ordered are listed, but only abnormal results are displayed)  Results for orders placed or performed during the hospital encounter of 04/12/17 (from the past 24 hour(s))  Urinalysis, Complete w Microscopic     Status: Abnormal   Collection Time: 04/12/17 11:53 AM  Result Value Ref Range   Color, Urine YELLOW (A) YELLOW   APPearance CLOUDY (A) CLEAR   Specific Gravity, Urine 1.018 1.005 - 1.030   pH 6.0 5.0 - 8.0   Glucose, UA NEGATIVE NEGATIVE mg/dL   Hgb urine dipstick LARGE (A) NEGATIVE   Bilirubin Urine NEGATIVE NEGATIVE   Ketones, ur NEGATIVE NEGATIVE mg/dL   Protein, ur 30 (A) NEGATIVE mg/dL   Nitrite NEGATIVE NEGATIVE   Leukocytes, UA NEGATIVE NEGATIVE   RBC / HPF TOO NUMEROUS TO COUNT 0 - 5 RBC/hpf   WBC, UA NONE SEEN 0 - 5 WBC/hpf   Bacteria, UA NONE SEEN NONE SEEN   Squamous Epithelial / LPF NONE SEEN NONE SEEN  CBC     Status: None   Collection Time: 04/12/17 11:53 AM  Result Value Ref Range   WBC 5.2 3.8 - 10.6 K/uL   RBC 4.81 4.40 - 5.90 MIL/uL   Hemoglobin 14.7 13.0 - 18.0 g/dL   HCT 43.6 40.0 - 52.0 %   MCV 90.6 80.0 - 100.0 fL   MCH 30.6 26.0 - 34.0 pg   MCHC 33.7 32.0 - 36.0 g/dL   RDW 12.7 11.5 - 14.5 %   Platelets 270 150 - 440 K/uL  Comprehensive metabolic panel     Status: Abnormal   Collection Time: 04/12/17 11:53 AM  Result Value Ref Range   Sodium 139 135 - 145 mmol/L   Potassium 4.2 3.5 - 5.1 mmol/L   Chloride 102 101 - 111 mmol/L   CO2 28 22 - 32 mmol/L   Glucose, Bld 93 65 - 99 mg/dL   BUN 14 6 - 20 mg/dL   Creatinine, Ser 0.85 0.61 - 1.24 mg/dL   Calcium 9.0 8.9 - 10.3 mg/dL   Total Protein 7.4 6.5 - 8.1 g/dL   Albumin 4.6 3.5 - 5.0 g/dL   AST 32 15 - 41 U/L   ALT 38 17 - 63 U/L   Alkaline Phosphatase 70 38 - 126 U/L    Total Bilirubin 1.4 (H) 0.3 - 1.2 mg/dL   GFR calc non Af Amer >60 >60 mL/min   GFR calc Af Amer >60 >60 mL/min   Anion gap 9 5 - 15   ____________________________________________ ____________________________________________  RADIOLOGY  I personally reviewed all radiographic images ordered to evaluate for the above acute complaints and reviewed radiology reports and findings.  These findings were personally discussed with the patient.  Please see medical record for radiology report.  ____________________________________________   PROCEDURES  Procedure(s) performed:  Procedures    Critical Care performed: no ____________________________________________   INITIAL IMPRESSION /  ASSESSMENT AND PLAN / ED COURSE  Pertinent labs & imaging results that were available during my care of the patient were reviewed by me and considered in my medical decision making (see chart for details).  DDX: stone, colic, colitis, msk strain, cystitis  [place] Jenifer Laura is a 27 y.o. who presents to the ED with p/w acute left lqabd pain with hematuria. No fevers, no systemic symptoms. + urinary symptoms. Denies trauma or injury. Afebrile in ED. Exam as above. Flank TTP, otherwise abdominal exam is benign. No peritoneal signs. Possible kidney stone, cystitis, or pyelonephritis.Marland Kitchen UA with hematuria but no pyuria. KUB and renal US reassuring.Clinical picture is not consistent with appendicitis, diverticulitis, pancreatitis, cholecystitis, bowel perforation, aortic dissection, splenic injury or acute abdominal process at this time.  Pain improved, tolerating PO. Repeat ABD exam benign, will plan supportive treatment and early follow up for recheck.       ____________________________________________   FINAL CLINICAL IMPRESSION(S) / ED DIAGNOSES  Final diagnoses:  Gross hematuria  Left lower quadrant pain      NEW MEDICATIONS STARTED DURING THIS VISIT:  Discharge Medication List as of 04/12/2017   3:48 PM    START taking these medications   Details  HYDROcodone-acetaminophen (NORCO) 5-325 MG tablet Take 1 tablet by mouth every 4 (four) hours as needed for moderate pain., Starting Thu 04/12/2017, Print    naproxen (NAPROSYN) 500 MG tablet Take 1 tablet (500 mg total) by mouth 2 (two) times daily with a meal., Starting Thu 04/12/2017, Until Fri 04/12/2018, Print    ondansetron (ZOFRAN) 4 MG tablet Take 1 tablet (4 mg total) by mouth daily as needed for nausea or vomiting., Starting Thu 04/12/2017, Until Fri 04/12/2018, Print         Note:  This document was prepared using Dragon voice recognition software and may include unintentional dictation errors.    Merlyn Lot, MD 04/12/17 [phone]

## 2017-04-12 NOTE — ED Notes (Signed)
4mg  morphine per PRN order per Dr. Quentin Cornwall.

## 2017-04-12 NOTE — ED Notes (Signed)
Says has history of backj pain, but currenly it is left lower quad and then he started having blood in urine.  Pt holding left side now.

## 2017-04-12 NOTE — Discharge Instructions (Signed)

## 2017-04-13 LAB — URINE CULTURE: Culture: NO GROWTH

## 2017-04-16 ENCOUNTER — Ambulatory Visit: Payer: BLUE CROSS/BLUE SHIELD | Admitting: Urology

## 2017-04-16 ENCOUNTER — Telehealth: Payer: Self-pay | Admitting: Urology

## 2017-04-16 ENCOUNTER — Encounter: Payer: Self-pay | Admitting: Urology

## 2017-04-16 VITALS — BP 98/62 | HR 75 | Ht 73.0 in | Wt 160.0 lb

## 2017-04-16 DIAGNOSIS — R109 Unspecified abdominal pain: Secondary | ICD-10-CM | POA: Diagnosis not present

## 2017-04-16 DIAGNOSIS — R310 Gross hematuria: Secondary | ICD-10-CM

## 2017-04-16 DIAGNOSIS — N200 Calculus of kidney: Secondary | ICD-10-CM | POA: Diagnosis not present

## 2017-04-16 LAB — URINALYSIS, COMPLETE
BILIRUBIN UA: NEGATIVE
Glucose, UA: NEGATIVE
KETONES UA: NEGATIVE
LEUKOCYTES UA: NEGATIVE
Nitrite, UA: NEGATIVE
RBC UA: NEGATIVE
SPEC GRAV UA: 1.02 (ref 1.005–1.030)
Urobilinogen, Ur: 1 mg/dL (ref 0.2–1.0)
pH, UA: 8 — ABNORMAL HIGH (ref 5.0–7.5)

## 2017-04-16 LAB — MICROSCOPIC EXAMINATION
Epithelial Cells (non renal): NONE SEEN /hpf (ref 0–10)
RBC, UA: NONE SEEN /hpf (ref 0–?)
WBC, UA: NONE SEEN /hpf (ref 0–?)

## 2017-04-16 MED ORDER — PREDNISONE 50 MG PO TABS: ORAL_TABLET | ORAL | 0 refills | Status: DC

## 2017-04-16 MED ORDER — RANITIDINE HCL 150 MG PO TABS: ORAL_TABLET | ORAL | 0 refills | Status: DC

## 2017-04-16 MED ORDER — DIPHENHYDRAMINE HCL 50 MG PO TABS: ORAL_TABLET | ORAL | 0 refills | Status: DC

## 2017-04-16 NOTE — Progress Notes (Signed)
04/16/2017 3:17 PM   Jeffery Cruz 1990/07/10 [id number]  Referring provider: Ander Slade, MD [address] [place], [place] [address]  No chief complaint on file.   HPI: Patient is a 27 -year-old Caucasian male who presents today as a referral from [facility] ED for gross hematuria.   He states that he started to blood in his urine starting on 04/12/2017 that continued through out the day.  He was also experiencing pain in his left lower quadrant into his left groin.  KUB taken on 04/12/2017 was negative for stones.  RUS taken on 04/12/2017 was negative for hydronephrosis.  UA was positive for TNTC RBC's.  His WBC count was 5.2.  His creatinine was 0.85.  Urine culture was negative.  He was given Norco, Naprosyn and Zofran upon discharge and instructed to follow up with [place].   He does not have a prior history of recurrent urinary tract infections, nephrolithiasis, trauma to the genitourinary tract, BPH or malignancies of the genitourinary tract.   He states that his mother had to have a kidney stone removed in the past.    Today, he is having symptoms of frequent urination, dysuria, nocturia and hesitancy.  These symptoms started on 04/12/2017.  His UA today is negative.  He has brought in pin-point sized fragments which he states he has captured in his strainer.    His still experiencing left back pain that radiates into the left waist.  He states that it is currently a 5 out of 10.  He states taking narcotic pain medicine helps the pain.  He is not noted any thing that makes the pain worse.  He denies any recent fevers, chills, nausea or vomiting.   He has had a KUB on 04/12/2017 and it was negative.  He had a RUS on 04/12/2017 which was negative.    He is not a smoker.  He is not exposed to secondhand smoke.  He has not worked with Sports administrator, trichloroethylene, etc.     PMH: Past Medical History:  Diagnosis Date  .  Allergy   . Asthma     Surgical History: Past Surgical History:  Procedure Laterality Date  . WISDOM TOOTH EXTRACTION Bilateral 2013     Home Medications:  Allergies as of 04/16/2017   No Known Allergies     Medication List        Accurate as of 04/16/17  3:17 PM. Always use your most recent med list.          cetirizine 10 MG tablet Commonly known as:  ZYRTEC Take 10 mg by mouth daily.   diazepam 5 MG tablet Commonly known as:  VALIUM Take 1 tablet (5 mg total) by mouth every 8 (eight) hours as needed for muscle spasms.   HYDROcodone-acetaminophen 5-325 MG tablet Commonly known as:  NORCO Take 1 tablet by mouth every 4 (four) hours as needed for moderate pain.   ibuprofen 800 MG tablet Commonly known as:  ADVIL,MOTRIN Take 1 tablet (800 mg total) by mouth every 8 (eight) hours as needed.   mometasone 50 MCG/ACT nasal spray Commonly known as:  NASONEX Place 2 sprays into the nose daily.   naproxen 500 MG tablet Commonly known as:  NAPROSYN Take 1 tablet (500 mg total) by mouth 2 (two) times daily with a meal.   ondansetron 4 MG tablet Commonly known as:  ZOFRAN Take 1 tablet (4 mg total) by mouth daily as needed  for nausea or vomiting.       Allergies: No Known Allergies  Family History: Family History  Problem Relation Age of Onset  . Allergies Father   . Allergies Sister   . Arthritis Maternal Grandmother   . Arthritis Paternal Grandmother     Social History:  reports that  has never smoked. he has never used smokeless tobacco. He reports that he drinks alcohol. He reports that he does not use drugs.  ROS: UROLOGY Frequent Urination?: Yes Hard to postpone urination?: No Burning/pain with urination?: Yes Get up at night to urinate?: Yes Leakage of urine?: No Urine stream starts and stops?: No Trouble starting stream?: Yes Do you have to strain to urinate?: No Blood in urine?: Yes Urinary tract infection?: No Sexually transmitted disease?:  No Injury to kidneys or bladder?: No Painful intercourse?: No Weak stream?: No Erection problems?: No Penile pain?: No  Gastrointestinal Nausea?: No Vomiting?: No Indigestion/heartburn?: No Diarrhea?: No Constipation?: No  Constitutional Fever: No Night sweats?: No Weight loss?: No Fatigue?: No  Skin Skin rash/lesions?: No Itching?: No  Eyes Blurred vision?: No Double vision?: No  Ears/Nose/Throat Sore throat?: No Sinus problems?: No  Hematologic/Lymphatic Swollen glands?: No Easy bruising?: No  Cardiovascular Leg swelling?: No Chest pain?: No  Respiratory Cough?: No Shortness of breath?: No  Endocrine Excessive thirst?: No  Musculoskeletal Back pain?: Yes Joint pain?: Yes  Neurological Headaches?: No Dizziness?: No  Psychologic Depression?: No Anxiety?: No  Physical Exam: BP 98/62   Pulse 75   Ht 6\' 1"  (1.854 m)   Wt 160 lb (72.6 kg)   BMI 21.11 kg/m   Constitutional: Well nourished. Alert and oriented, No acute distress. HEENT: [place] AT, moist mucus membranes. Trachea midline, no masses. Cardiovascular: No clubbing, cyanosis, or edema. Respiratory: Normal respiratory effort, no increased work of breathing. GI: Abdomen is soft, non tender, non distended, no abdominal masses. Liver and spleen not palpable.  No hernias appreciated.  Stool sample for occult testing is not indicated.   GU: No CVA tenderness.  No bladder fullness or masses.  Patient with circumcised phallus.  Urethral meatus is patent.  No penile discharge. No penile lesions or rashes. Scrotum without lesions, cysts, rashes and/or edema.  Testicles are located scrotally bilaterally. No masses are appreciated in the testicles. Left and right epididymis are normal. Rectal: Patient with  normal sphincter tone. Anus and perineum without scarring or rashes. No rectal masses are appreciated. Prostate is approximately 35 grams, no nodules are appreciated. Seminal vesicles are normal. Skin:  No rashes, bruises or suspicious lesions. Lymph: No cervical or inguinal adenopathy. Neurologic: Grossly intact, no focal deficits, moving all 4 extremities. Psychiatric: Normal mood and affect.  Laboratory Data: Lab Results  Component Value Date   WBC 5.2 04/12/2017   HGB 14.7 04/12/2017   HCT 43.6 04/12/2017   MCV 90.6 04/12/2017   PLT 270 04/12/2017    Lab Results  Component Value Date   CREATININE 0.85 04/12/2017    No results found for: PSA  No results found for: TESTOSTERONE  No results found for: HGBA1C  No results found for: TSH  No results found for: CHOL, HDL, CHOLHDL, VLDL, LDLCALC  Lab Results  Component Value Date   AST 32 04/12/2017   Lab Results  Component Value Date   ALT 38 04/12/2017   No components found for: ALKALINEPHOPHATASE No components found for: BILIRUBINTOTAL  No results found for: ESTRADIOL   Urinalysis Negative. See Epic.  I have reviewed the labs.  Pertinent Imaging: CLINICAL DATA:  Acute left abdominal pain today.  EXAM: ABDOMEN - 1 VIEW  COMPARISON:  None.  FINDINGS: The bowel gas pattern is normal. No radio-opaque calculi or other significant radiographic abnormality are seen.  IMPRESSION: Negative.   Electronically Signed   By: Margarette Canada M.D.   On: 04/12/2017 15:14  CLINICAL DATA:  Left flank pain, hematuria  EXAM: RENAL / URINARY TRACT ULTRASOUND COMPLETE  COMPARISON:  None.  FINDINGS: Right Kidney:  Length: 10.4 cm. Echogenicity within normal limits. No mass or hydronephrosis visualized.  Left Kidney:  Length: 11 cm. Echogenicity within normal limits. No mass or hydronephrosis visualized.  Bladder:  Appears normal for degree of bladder distention.  IMPRESSION: Normal renal ultrasound.   Electronically Signed   By: Kathreen Devoid   On: 04/12/2017 15:35  I have reviewed the labs.  Assessment & Plan:    1. Gross hematuria  - I explained to the patient that there  are a number of causes that can be associated with blood in the urine, such as stones, BPH, UTI's, damage to the urinary tract and/or cancer.  - At this time, I felt that the patient warranted further urologic evaluation.   The AUA guidelines state that a CT urogram is the preferred imaging study to evaluate hematuria.  - I explained to the patient that a contrast material will be injected into a vein and that in rare instances, an allergic reaction can result and may even life threatening   The patient denies any allergies to contrast, iodine and is not taking metformin.  He states he has an allergic reaction to shrimp, but he can eat crab legs without issue   - Following the imaging study,  I've recommended a cystoscopy. I described how this is performed, typically in an office setting with a flexible cystoscope. We described the risks, benefits, and possible side effects, the most common of which is a minor amount of blood in the urine and/or burning which usually resolves in 24 to 48 hours.   - The patient had the opportunity to ask questions which were answered. Based upon this discussion, the patient is willing to proceed. Therefore, I've ordered: a CT Urogram and cystoscopy.  - The patient will return following all of the above for discussion of the results.   - BUN+Creat  - Urinalysis, Complete  - CULTURE, URINE COMPREHENSIVE  - CT HEMATURIA WORKUP; Future  2. Left side pain - awaiting CTU results  - Advised to contact our office or seek treatment in the ED if becomes febrile or pain/ vomiting are difficult control in order to arrange for emergent/urgent intervention   Return for CT Urogram report and cystoscopy.  These notes generated with voice recognition software. I apologize for typographical errors.  Zara Council, [facility] [address] [place], [place] [address] [phone]

## 2017-04-16 NOTE — Telephone Encounter (Signed)
Please let Roverto know that his allergy prep has been called into the pharmacy to take prior to his CT scan.

## 2017-04-17 LAB — BUN+CREAT
BUN/Creatinine Ratio: 12 (ref 9–20)
BUN: 11 mg/dL (ref 6–20)
CREATININE: 0.89 mg/dL (ref 0.76–1.27)
GFR calc Af Amer: 138 mL/min/{1.73_m2} (ref >59)
GFR calc non Af Amer: 120 mL/min/{1.73_m2} (ref >59)

## 2017-04-17 NOTE — Telephone Encounter (Signed)
Called and informed pt.  

## 2017-04-19 ENCOUNTER — Ambulatory Visit
Admission: RE | Admit: 2017-04-19 | Discharge: 2017-04-19 | Disposition: A | Discharge status: Home / Self Care | Payer: BLUE CROSS/BLUE SHIELD | Source: Ambulatory Visit | Attending: Urology | Admitting: Urology

## 2017-04-19 ENCOUNTER — Telehealth: Payer: Self-pay | Admitting: Urology

## 2017-04-19 DIAGNOSIS — N200 Calculus of kidney: Secondary | ICD-10-CM | POA: Diagnosis not present

## 2017-04-19 DIAGNOSIS — R310 Gross hematuria: Secondary | ICD-10-CM | POA: Diagnosis not present

## 2017-04-19 LAB — CULTURE, URINE COMPREHENSIVE

## 2017-04-19 MED ORDER — IOPAMIDOL (ISOVUE-300) INJECTION 61%
150.0000 mL | Freq: Once | INTRAVENOUS | Status: AC | PRN
  Administered 2017-04-19: 150 mL via INTRAVENOUS

## 2017-04-19 NOTE — Telephone Encounter (Signed)
Patient's mom called and stated that he was going to come in for his CT results tomorrow but he did not want the have the Cysto done. He does not want it, I explained why we did it and told her that he could discuss this with the provider at the appointment.   Sharyn Lull

## 2017-04-20 ENCOUNTER — Ambulatory Visit (INDEPENDENT_AMBULATORY_CARE_PROVIDER_SITE_OTHER): Payer: BLUE CROSS/BLUE SHIELD | Admitting: Urology

## 2017-04-20 ENCOUNTER — Encounter: Payer: Self-pay | Admitting: Urology

## 2017-04-20 VITALS — BP 108/62 | HR 80 | Resp 16 | Ht 73.0 in | Wt 161.0 lb

## 2017-04-20 DIAGNOSIS — R310 Gross hematuria: Secondary | ICD-10-CM | POA: Diagnosis not present

## 2017-04-20 LAB — URINALYSIS, COMPLETE
BILIRUBIN UA: NEGATIVE
GLUCOSE, UA: NEGATIVE
Leukocytes, UA: NEGATIVE
NITRITE UA: NEGATIVE
RBC UA: NEGATIVE
Specific Gravity, UA: 1.015 (ref 1.005–1.030)
UUROB: 1 mg/dL (ref 0.2–1.0)
pH, UA: 7 (ref 5.0–7.5)

## 2017-04-20 LAB — MICROSCOPIC EXAMINATION
Epithelial Cells (non renal): NONE SEEN /hpf (ref 0–10)
RBC MICROSCOPIC, UA: NONE SEEN /HPF (ref 0–?)
WBC UA: NONE SEEN /HPF (ref 0–?)

## 2017-04-23 ENCOUNTER — Encounter: Payer: Self-pay | Admitting: Urology

## 2017-04-23 NOTE — Progress Notes (Signed)
04/20/2017 1:23 PM   Jeffery Cruz [birth year] [id number]  Referring provider: Ander Slade, MD [facility] [address] [place], [place] [address]  No chief complaint on file.   HPI: 27 year old male recently seen by Zara Council after an episode of gross hematuria.  CTU and cystoscopy were recommended.  CT urogram does show a punctate left renal calculus and small bilateral renal hypodensities which were too small to characterize.  He was scheduled for cystoscopy today however does not desire to have performed at this time.  He denies recurrent gross hematuria.  Urinalysis today was unremarkable.   PMH: Past Medical History:  Diagnosis Date  . Allergy   . Asthma     Surgical History: Past Surgical History:  Procedure Laterality Date  . WISDOM TOOTH EXTRACTION Bilateral 2013     Home Medications:  Allergies as of 04/20/2017   No Known Allergies     Medication List        Accurate as of 04/20/17 11:59 PM. Always use your most recent med list.          cetirizine 10 MG tablet Commonly known as:  ZYRTEC Take 10 mg by mouth daily.   diazepam 5 MG tablet Commonly known as:  VALIUM Take 1 tablet (5 mg total) by mouth every 8 (eight) hours as needed for muscle spasms.   diphenhydrAMINE 50 MG tablet Commonly known as:  BENADRYL Take one hour prior to the CT scan   HYDROcodone-acetaminophen 5-325 MG tablet Commonly known as:  NORCO Take 1 tablet by mouth every 4 (four) hours as needed for moderate pain.   ibuprofen 800 MG tablet Commonly known as:  ADVIL,MOTRIN Take 1 tablet (800 mg total) by mouth every 8 (eight) hours as needed.   mometasone 50 MCG/ACT nasal spray Commonly known as:  NASONEX Place 2 sprays into the nose daily.   naproxen 500 MG tablet Commonly known as:  NAPROSYN Take 1 tablet (500 mg total) by mouth 2 (two) times daily with a meal.   ondansetron 4 MG tablet Commonly known as:  ZOFRAN Take 1  tablet (4 mg total) by mouth daily as needed for nausea or vomiting.   predniSONE 50 MG tablet Commonly known as:  DELTASONE Take the one tablet 13 hours, 7 hours and 1 hours prior to the CT Urogram   ranitidine 150 MG tablet Commonly known as:  ZANTAC Take one tablet 1 hour prior to CT Urogram       Allergies: No Known Allergies  Family History: Family History  Problem Relation Age of Onset  . Allergies Father   . Allergies Sister   . Arthritis Maternal Grandmother   . Arthritis Paternal Grandmother     Social History:  reports that  has never smoked. he has never used smokeless tobacco. He reports that he drinks alcohol. He reports that he does not use drugs.  ROS: No significant change from 04/16/2017   Physical Exam: BP 108/62   Pulse 80   Resp 16   Ht 6\' 1"  (1.854 m)   Wt 161 lb (73 kg)   SpO2 98%   BMI 21.24 kg/m   Constitutional:  Alert and oriented, No acute distress. HEENT: [place] AT, moist mucus membranes.  Trachea midline, no masses. Cardiovascular: No clubbing, cyanosis, or edema. Respiratory: Normal respiratory effort, no increased work of breathing. GI: Abdomen is soft, nontender, nondistended, no abdominal masses GU: No CVA tenderness Lymph: No cervical or inguinal lymphadenopathy. Skin:  No rashes, bruises or suspicious lesions. Neurologic: Grossly intact, no focal deficits, moving all 4 extremities. Psychiatric: Normal mood and affect.  Laboratory Data: Lab Results  Component Value Date   WBC 5.2 04/12/2017   HGB 14.7 04/12/2017   HCT 43.6 04/12/2017   MCV 90.6 04/12/2017   PLT 270 04/12/2017    Lab Results  Component Value Date   CREATININE 0.89 04/16/2017     Urinalysis Dipstick: Trace protein, ketone Microscopy: Negative  Pertinent Imaging: Images were personally reviewed  Assessment & Plan:   CT urogram showed a punctate renal calculus which would not of been the source of his gross hematuria.  Is possible he passed a stone which  was responsible for the bleeding.  Although bladder tumors would be unlikely in a patient his age cystoscopy would be recommended in the outside chance there is significant pathology in the bladder.  He has refused the procedure at this time and is willing to accept the risks of undiagnosed tumors.  I did offer to schedule this procedure in same day surgery under anesthesia/sedation.  His mother was with him today and urged him to have the procedure done.  He would like to think over before scheduling.  - Urinalysis, Complete  Greater than 50% of this 15-minute visit was spent counseling the patient.  Abbie Sons, [place] [address] [place],  [address] [phone]

## 2017-05-02 ENCOUNTER — Other Ambulatory Visit: Payer: BLUE CROSS/BLUE SHIELD | Admitting: Urology

## 2017-06-11 ENCOUNTER — Ambulatory Visit: Payer: BLUE CROSS/BLUE SHIELD | Admitting: Urology

## 2017-08-14 DIAGNOSIS — T63461A Toxic effect of venom of wasps, accidental (unintentional), initial encounter: Secondary | ICD-10-CM | POA: Diagnosis not present

## 2017-08-14 DIAGNOSIS — S40261A Insect bite (nonvenomous) of right shoulder, initial encounter: Secondary | ICD-10-CM | POA: Diagnosis not present

## 2017-08-28 ENCOUNTER — Encounter: Payer: Self-pay | Admitting: Internal Medicine

## 2017-08-28 ENCOUNTER — Ambulatory Visit: Payer: BLUE CROSS/BLUE SHIELD | Admitting: Internal Medicine

## 2017-08-28 VITALS — BP 120/70 | HR 67 | Temp 98.0°F | Ht 73.0 in | Wt 159.4 lb

## 2017-08-28 DIAGNOSIS — T63441A Toxic effect of venom of bees, accidental (unintentional), initial encounter: Secondary | ICD-10-CM | POA: Diagnosis not present

## 2017-08-28 DIAGNOSIS — N200 Calculus of kidney: Secondary | ICD-10-CM

## 2017-08-28 DIAGNOSIS — R319 Hematuria, unspecified: Secondary | ICD-10-CM

## 2017-08-28 MED ORDER — EPINEPHRINE 0.3 MG/0.3ML IJ SOAJ: 0.3000 mg | Freq: Once | INTRAMUSCULAR | 1 refills | Status: AC

## 2017-08-28 NOTE — Patient Instructions (Addendum)
Think about Tdap and more blood work  Read info below  Ask pharmacist how to use Epi Pen  F/u in 6-12 months  Let me know if you want a referral to Dr. Nicole Cruz dermatologist/skin doctor   Dietary Guidelines to Help Prevent Kidney Stones Kidney stones are deposits of minerals and salts that form inside your kidneys. Your risk of developing kidney stones may be greater depending on your diet, your lifestyle, the medicines you take, and whether you have certain medical conditions. Most people can reduce their chances of developing kidney stones by following the instructions below. Depending on your overall health and the type of kidney stones you tend to develop, your dietitian may give you more specific instructions. What are tips for following this plan? Reading food labels  Choose foods with "no salt added" or "low-salt" labels. Limit your sodium intake to less than 1500 mg per day.  Choose foods with calcium for each meal and snack. Try to eat about 300 mg of calcium at each meal. Foods that contain 200-500 mg of calcium per serving include: ? 8 oz (237 ml) of milk, fortified nondairy milk, and fortified fruit juice. ? 8 oz (237 ml) of kefir, yogurt, and soy yogurt. ? 4 oz (118 ml) of tofu. ? 1 oz of cheese. ? 1 cup (300 g) of dried figs. ? 1 cup (91 g) of cooked broccoli. ? 1-3 oz can of sardines or mackerel.  Most people need 1000 to 1500 mg of calcium each day. Talk to your dietitian about how much calcium is recommended for you. Shopping  Buy plenty of fresh fruits and vegetables. Most people do not need to avoid fruits and vegetables, even if they contain nutrients that may contribute to kidney stones.  When shopping for convenience foods, choose: ? Whole pieces of fruit. ? Premade salads with dressing on the side. ? Low-fat fruit and yogurt smoothies.  Avoid buying frozen meals or prepared deli foods.  Look for foods with live cultures, such as yogurt and  kefir. Cooking  Do not add salt to food when cooking. Place a salt shaker on the table and allow each person to add his or her own salt to taste.  Use vegetable protein, such as beans, textured vegetable protein (TVP), or tofu instead of meat in pasta, casseroles, and soups. Meal planning  Eat less salt, if told by your dietitian. To do this: ? Avoid eating processed or premade food. ? Avoid eating fast food.  Eat less animal protein, including cheese, meat, poultry, or fish, if told by your dietitian. To do this: ? Limit the number of times you have meat, poultry, fish, or cheese each week. Eat a diet free of meat at least 2 days a week. ? Eat only one serving each day of meat, poultry, fish, or seafood. ? When you prepare animal protein, cut pieces into small portion sizes. For most meat and fish, one serving is about the size of one deck of cards.  Eat at least 5 servings of fresh fruits and vegetables each day. To do this: ? Keep fruits and vegetables on hand for snacks. ? Eat 1 piece of fruit or a handful of berries with breakfast. ? Have a salad and fruit at lunch. ? Have two kinds of vegetables at dinner.  Limit foods that are high in a substance called oxalate. These include: ? Spinach. ? Rhubarb. ? Beets. ? Potato chips and french fries. ? Nuts.  If you regularly take a  diuretic medicine, make sure to eat at least 1-2 fruits or vegetables high in potassium each day. These include: ? Avocado. ? Banana. ? Orange, prune, carrot, or tomato juice. ? Baked potato. ? Cabbage. ? Beans and split peas. General instructions  Drink enough fluid to keep your urine clear or pale yellow. This is the most important thing you can do.  Talk to your health care provider and dietitian about taking daily supplements. Depending on your health and the cause of your kidney stones, you may be advised: ? Not to take supplements with vitamin C. ? To take a calcium supplement. ? To take a  daily probiotic supplement. ? To take other supplements such as magnesium, fish oil, or vitamin B6.  Take all medicines and supplements as told by your health care provider.  Limit alcohol intake to no more than 1 drink a day for nonpregnant women and 2 drinks a day for men. One drink equals 12 oz of beer, 5 oz of wine, or 1 oz of hard liquor.  Lose weight if told by your health care provider. Work with your dietitian to find strategies and an eating plan that works best for you. What foods are not recommended? Limit your intake of the following foods, or as told by your dietitian. Talk to your dietitian about specific foods you should avoid based on the type of kidney stones and your overall health. Grains Breads. Bagels. Rolls. Baked goods. Salted crackers. Cereal. Pasta. Vegetables Spinach. Rhubarb. Beets. Canned vegetables. Jeffery Cruz. Olives. Meats and other protein foods Nuts. Nut butters. Large portions of meat, poultry, or fish. Salted or cured meats. Deli meats. Hot dogs. Sausages. Dairy Cheese. Beverages Regular soft drinks. Regular vegetable juice. Seasonings and other foods Seasoning blends with salt. Salad dressings. Canned soups. Soy sauce. Ketchup. Barbecue sauce. Canned pasta sauce. Casseroles. Pizza. Lasagna. Frozen meals. Potato chips. [place] fries. Summary  You can reduce your risk of kidney stones by making changes to your diet.  The most important thing you can do is drink enough fluid. You should drink enough fluid to keep your urine clear or pale yellow.  Ask your health care provider or dietitian how much protein from animal sources you should eat each day, and also how much salt and calcium you should have each day. This information is not intended to replace advice given to you by your health care provider. Make sure you discuss any questions you have with your health care provider. Document Released: 05/20/2010 Document Revised: 01/04/2016 Document Reviewed:  01/04/2016 Elsevier Interactive Patient Education  2018 [employer].  Kidney Stones Kidney stones (urolithiasis) are solid, rock-like deposits that form inside of the organs that make urine (kidneys). A kidney stone may form in a kidney and move into the bladder, where it can cause intense pain and block the flow of urine. Kidney stones are created when high levels of certain minerals are found in the urine. They are usually passed through urination, but in some cases, medical treatment may be needed to remove them. What are the causes? Kidney stones may be caused by:  A condition in which certain glands produce too much parathyroid hormone (primary hyperparathyroidism), which causes too much calcium buildup in the blood.  Buildup of uric acid crystals in the bladder (hyperuricosuria). Uric acid is a chemical that the body produces when you eat certain foods. It usually exits the body in the urine.  Narrowing (stricture) of one or both of the tubes that drain urine from the  kidneys to the bladder (ureters).  A kidney blockage that is present at birth (congenital obstruction).  Past surgery on the kidney or the ureters, such as gastric bypass surgery.  What increases the risk? The following factors make you more likely to develop kidney stones:  Having had a kidney stone in the past.  Having a family history of kidney stones.  Not drinking enough water.  Eating a diet that is high in protein, salt (sodium), or sugar.  Being overweight or obese.  What are the signs or symptoms? Symptoms of a kidney stone may include:  Nausea.  Vomiting.  Blood in the urine (hematuria).  Pain in the side of the abdomen, right below the ribs (flank pain). Pain usually spreads (radiates) to the groin.  Needing to urinate frequently or urgently.  How is this diagnosed? This condition may be diagnosed based on:  Your medical history.  A physical exam.  Blood tests.  Urine tests.  CT  scan.  Abdominal X-ray.  A procedure to examine the inside of the bladder (cystoscopy).  How is this treated? Treatment for kidney stones depends on the size, location, and makeup of the stones. Treatment may involve:  Analyzing your urine before and after you pass the stone through urination.  Being monitored at the hospital until you pass the stone through urination.  Increasing your fluid intake and decreasing the amount of calcium and protein in your diet.  A procedure to break up kidney stones in the bladder using: ? A focused beam of light (laser therapy). ? Shock waves (extracorporeal shock wave lithotripsy).  Surgery to remove kidney stones. This may be needed if you have severe pain or have stones that block your urinary tract.  Follow these instructions at home: Eating and drinking   Drink enough fluid to keep your urine clear or pale yellow. This will help you to pass the kidney stone.  If directed, change your diet. This may include: ? Limiting how much sodium you eat. ? Eating more fruits and vegetables. ? Limiting how much meat, poultry, fish, and eggs you eat.  Follow instructions from your health care provider about eating or drinking restrictions. General instructions  Collect urine samples as told by your health care provider. You may need to collect a urine sample: ? 24 hours after you pass the stone. ? 8-12 weeks after passing the kidney stone, and every 6-12 months after that.  Strain your urine every time you urinate, for as long as directed. Use the strainer that your health care provider recommends.  Do not throw out the kidney stone after passing it. Keep the stone so it can be tested by your health care provider. Testing the makeup of your kidney stone may help prevent you from getting kidney stones in the future.  Take over-the-counter and prescription medicines only as told by your health care provider.  Keep all follow-up visits as told by  your health care provider. This is important. You may need follow-up X-rays or ultrasounds to make sure that your stone has passed. How is this prevented? To prevent another kidney stone:  Drink enough fluid to keep your urine clear or pale yellow. This is the best way to prevent kidney stones.  Eat a healthy diet and follow recommendations from your health care provider about foods to avoid. You may be instructed to eat a low-protein diet. Recommendations vary depending on the type of kidney stone that you have.  Maintain a healthy weight.  Contact  a health care provider if:  You have pain that gets worse or does not get better with medicine. Get help right away if:  You have a fever or chills.  You develop severe pain.  You develop new abdominal pain.  You faint.  You are unable to urinate. This information is not intended to replace advice given to you by your health care provider. Make sure you discuss any questions you have with your health care provider. Document Released: 01/23/2005 Document Revised: 08/13/2015 Document Reviewed: 07/09/2015 Elsevier Interactive Patient Education  2018 [employer].  Anaphylactic Reaction An anaphylactic reaction (anaphylaxis) is a sudden allergic reaction that is very bad (severe). It also affects more than one part of the body. This condition can be life-threatening. If you have an anaphylactic reaction, you need to get medical help right away. You may need to stay in the hospital. Your doctor may teach you how to use an allergy kit (anaphylaxis kit) and how to give yourself an allergy shot (epinephrine injection). You can give yourself an allergy shot with what is commonly called an auto-injector "pen." Symptoms of an anaphylactic reaction may include:  A stuffy nose (nasal congestion).  Headache.  Tingling in your mouth.  A flushed face.  An itchy, red rash.  Swelling of your eyes, lips, face, or tongue.  Swelling of the back  of your mouth and your throat.  Breathing loudly (wheezing).  A hoarse voice.  Itchy, red, swollen areas of skin (hives).  Dizziness or light-headedness.  Passing out (fainting).  Feeling worried or nervous (anxiety).  Feeling confused.  Pain in your belly (abdomen) or chest.  Trouble with breathing, talking, or swallowing.  A tight feeling in your chest or throat.  Fast or uneven heartbeats (palpitations).  Throwing up (vomiting).  Watery poop (diarrhea).  Follow these instructions at home: Safety  Always keep an auto-injector pen or your allergy kit with you. These could save your life. Use them as told by your doctor.  Do not drive until your doctor says that it is safe.  Make sure that you, the people who live with you, and your employer know: ? How to use your allergy kit. ? How to use an auto-injector pen to give you an allergy shot.  If you used your auto-injector pen: ? Get more medicine for it right away. This is important in case you have another reaction. ? Get help right away.  Wear a bracelet or necklace that says you have an allergy, if your doctor tells you to do this.  Learn the signs of a very bad allergic reaction.  Work with your doctors to make a plan for what to do if you have a very bad allergic reaction. Being prepared is important. General instructions  Take over-the-counter and prescription medicines only as told by your doctor.  If you have itchy, red, swollen areas of skin or a rash: ? Use over-the-counter medicine (antihistamine) as told by your doctor. ? Put cold, wet cloths (cold compresses) on your skin. ? Take baths or showers in cool water. Avoid hot water.  If you had tests done, it is up to you to get your test results. Ask your doctor when your results will be ready.  Tell any doctors who care for you that you have an allergy.  Keep all follow-up visits as told by your doctor. This is important. How is this  prevented?  Avoid things (allergens) that gave you a very bad allergic reaction before.  If you have a food allergy and you go to a restaurant, tell your server about your allergy. If you are not sure if your meal was made with food that you are allergic to, ask your server before you eat it. Contact a doctor if:  You have symptoms of an allergic reaction. You may notice them soon after being around whatever it is that you are allergic to. Symptoms may include: ? A rash. ? A headache. ? Sneezing or a runny nose. ? Swelling. ? Feeling sick to your stomach. ? Watery poop. Get help right away if:  You had to use your auto-injector pen. You must go to the emergency room even if the medicine seems to be working.  You have any of these: ? A tight feeling in your chest or your throat. ? Loud breathing. ? Trouble with breathing. ? Itchy, red, swollen areas of skin. ? Red skin or itching all over your body. ? Swelling in your lips, tongue, or the back of your throat.  You have throwing up that gets very bad.  You have watery poop that gets very bad.  You pass out or feel like you might pass out. These symptoms may be an emergency. Do not wait to see if the symptoms will go away. Use your auto-injector pen or allergy kit as you have been told. Get medical help right away. Call your local emergency services (911 in the [place] Do not drive yourself to the hospital. Summary  An anaphylactic reaction (anaphylaxis) is a sudden allergic reaction that is very bad (severe).  This condition can be life-threatening. If you have an anaphylactic reaction, you need to get medical help right away.  Your doctor may teach you how to use an allergy kit (anaphylaxis kit) and how to give yourself an allergy shot (epinephrine injection) with an auto-injector "pen."  Always keep an auto-injector pen or your allergy kit with you. These could save your life. Use them as told by your doctor.  If you had to  use your auto-injector pen, you must go to the emergency room even if the medicine seems to be working. This information is not intended to replace advice given to you by your health care provider. Make sure you discuss any questions you have with your health care provider. Document Released: 07/12/2007 Document Revised: 09/17/2015 Document Reviewed: 09/17/2015 Elsevier Interactive Patient Education  2017 [place] or [employer], Adult Bees, wasps, and hornets are part of a family of insects that can sting people. These stings can cause pain and inflammation, but they are usually not serious. However, some people may have an allergic reaction to a sting. This can cause the symptoms to be more severe. What increases the risk? You may be at a greater risk of getting stung if you:  Provoke a stinging insect by swatting or disturbing it.  Wear strong-smelling soaps, deodorants, or body sprays.  Spend time outdoors near gardens with flowers or fruit trees or in clothes that expose skin.  Eat or drink outside.  What are the signs or symptoms? Common symptoms of this condition include:  A red lump in the skin that sometimes has a tiny hole in the center. In some cases, a stinger may be in the center of the wound.  Pain and itching at the sting site.  Redness and swelling around the sting site. If you have an allergic reaction (localized allergic reaction), the swelling and redness may spread out from the  sting site. In some cases, this reaction can continue to develop over the next 24-48 hours.  In rare cases, a person may have a severe allergic reaction (anaphylactic reaction) to a sting. Symptoms of an anaphylactic reaction may include:  Wheezing or difficulty breathing.  Raised, itchy, red patches on the skin (hives).  Nausea or vomiting.  Abdominal cramping.  Diarrhea.  Tightness in the chest or chest pain.  Dizziness or fainting.  Redness of the face  (flushing).  Hoarse voice.  Swollen tongue, lips, or face.  How is this diagnosed? This condition is usually diagnosed based on your symptoms and medical history as well as a physical exam. You may have an allergy test to determine if you are allergic to the substance that the insect injected during the sting (venom). How is this treated? If you were stung by a bee, the stinger and a small sac of venom may be in the wound. It is important to remove the stinger as soon as possible. You can do this by brushing across the wound with gauze, a fingernail, or a flat card such as a credit card. Removing the stinger can help reduce the severity of your body's reaction to the sting. Most stings can be treated with:  Icing to reduce swelling in the area.  Medicines (antihistamines) to treat itching or an allergic reaction.  Medicines to help reduce pain. These may be medicines that you take by mouth, or medicated creams or lotions that you apply to your skin.  Pay close attention to your symptoms after you have been stung. If possible, have someone stay with you to make sure you do not have an allergic reaction. If you have any signs of an allergic reaction, call your health care provider. If you have ever had a severe allergic reaction, your health care provider may give you an inhaler or injectable medicine (epinephrine auto-injector) to use if necessary. Follow these instructions at home:  Wash the sting site 2-3 times each day with soap and water as told by your health care provider.  Apply or take over-the-counter and prescription medicines only as told by your health care provider.  If directed, apply ice to the sting area. ? Put ice in a plastic bag. ? Place a towel between your skin and the bag. ? Leave the ice on for 20 minutes, 2-3 times a day.  Do not scratch the sting area.  If you had a severe allergic reaction to a sting, you may need: ? To wear a medical bracelet or necklace  that lists the allergy. ? To learn when and how to use an anaphylaxis kit or epinephrine injection. Your family members and coworkers may also need to learn this. ? To carry an anaphylaxis kit or epinephrine injection with you at all times. How is this prevented?  Avoid swatting at stinging insects and disturbing insect nests.  Do not use fragrant soaps or lotions.  Wear shoes, pants, and long sleeves when spending time outdoors, especially in grassy areas where stinging insects are common.  Keep outdoor areas free from nests or hives.  Keep food and drink containers covered when eating outdoors.  Avoid working or sitting near [employer], if possible.  Wear gloves if you are gardening or working outdoors.  If an attack by a stinging insect or a swarm seems likely in the moment, move away from the area or find a barrier between you and the insect(s), such as a door. Contact a health care  provider if:  Your symptoms do not get better in 2-3 days.  You have redness, swelling, or pain that spreads beyond the area of the sting.  You have a fever. Get help right away if: You have symptoms of a severe allergic reaction. These include:  Wheezing or difficulty breathing.  Tightness in the chest or chest pain.  Light-headedness or fainting.  Itchy, raised, red patches on the skin.  Nausea or vomiting.  Abdominal cramping.  Diarrhea.  A swollen tongue or lips, or trouble swallowing.  Dizziness or fainting.  Summary  Stings from bees, wasps, and hornets can cause pain and inflammation, but they are usually not serious. However, some people may have an allergic reaction to a sting. This can cause the symptoms to be more severe.  Pay close attention to your symptoms after you have been stung. If possible, have someone stay with you to make sure you do not have an allergic reaction.  Call your health care provider if you have any signs of an allergic reaction. This  information is not intended to replace advice given to you by your health care provider. Make sure you discuss any questions you have with your health care provider. Document Released: 01/23/2005 Document Revised: 03/30/2016 Document Reviewed: 03/30/2016 Elsevier Interactive Patient Education  2018 [employer].   DTaP Vaccine (Diphtheria, Tetanus, and Pertussis): What You Need to Know 1. Why get vaccinated? Diphtheria, tetanus, and pertussis are serious diseases caused by bacteria. Diphtheria and pertussis are spread from person to person. Tetanus enters the body through cuts or wounds. DIPHTHERIA causes a thick covering in the back of the throat.  It can lead to breathing problems, paralysis, heart failure, and even death.  TETANUS (Lockjaw) causes painful tightening of the muscles, usually all over the body.  It can lead to "locking" of the jaw so the victim cannot open his mouth or swallow. Tetanus leads to death in up to 2 out of 10 cases.  PERTUSSIS (Whooping Cough) causes coughing spells so bad that it is hard for infants to eat, drink, or breathe. These spells can last for weeks.  It can lead to pneumonia, seizures (jerking and staring spells), brain damage, and death.  Diphtheria, tetanus, and pertussis vaccine (DTaP) can help prevent these diseases. Most children who are vaccinated with DTaP will be protected throughout childhood. Many more children would get these diseases if we stopped vaccinating. DTaP is a safer version of an older vaccine called DTP. DTP is no longer used in the [place]. 2. Who should get DTaP vaccine and when? Children should get 5 doses of DTaP vaccine, one dose at each of the following ages:  2 months  4 months  6 months  15-18 months  4-6 years  DTaP may be given at the same time as other vaccines. 3. Some children should not get DTaP vaccine or should wait  Children with minor illnesses, such as a cold, may be vaccinated. But children  who are moderately or severely ill should usually wait until they recover before getting DTaP vaccine.  Any child who had a life-threatening allergic reaction after a dose of DTaP should not get another dose.  Any child who suffered a brain or nervous system disease within 7 days after a dose of DTaP should not get another dose.  Talk with your doctor if your child: ? had a seizure or collapsed after a dose of DTaP, ? cried non-stop for 3 hours or more after a dose of  DTaP, ? had a fever over 105F after a dose of DTaP. Ask your doctor for more information. Some of these children should not get another dose of pertussis vaccine, but may get a vaccine without pertussis, called DT. 4. Older children and adults DTaP is not licensed for adolescents, adults, or children 78 years of age and older. But older people still need protection. A vaccine called Tdap is similar to DTaP. A single dose of Tdap is recommended for people 11 through 25 years of age. Another vaccine, called Td, protects against tetanus and diphtheria, but not pertussis. It is recommended every 10 years. There are separate Vaccine Information Statements for these vaccines. 5. What are the risks from DTaP vaccine? Getting diphtheria, tetanus, or pertussis disease is much riskier than getting DTaP vaccine. However, a vaccine, like any medicine, is capable of causing serious problems, such as severe allergic reactions. The risk of DTaP vaccine causing serious harm, or death, is extremely small. Mild problems (common)  Fever (up to about 1 child in 4)  Redness or swelling where the shot was given (up to about 1 child in 4)  Soreness or tenderness where the shot was given (up to about 1 child in 4) These problems occur more often after the 4th and 5th doses of the DTaP series than after earlier doses. Sometimes the 4th or 5th dose of DTaP vaccine is followed by swelling of the entire arm or leg in which the shot was given, lasting 1-7  days (up to about 1 child in 27). Other mild problems include:  Fussiness (up to about 1 child in 3)  Tiredness or poor appetite (up to about 1 child in 10)  Vomiting (up to about 1 child in 41) These problems generally occur 1-3 days after the shot. Moderate problems (uncommon)  Seizure (jerking or staring) (about 1 child out of 14,000)  Non-stop crying, for 3 hours or more (up to about 1 child out of 1,000)  High fever, over 105F (about 1 child out of 16,000) Severe problems (very rare)  Serious allergic reaction (less than 1 out of a million doses)  Several other severe problems have been reported after DTaP vaccine. These include: ? Long-term seizures, coma, or lowered consciousness ? Permanent brain damage. These are so rare it is hard to tell if they are caused by the vaccine. Controlling fever is especially important for children who have had seizures, for any reason. It is also important if another family member has had seizures. You can reduce fever and pain by giving your child an aspirin-free pain reliever when the shot is given, and for the next 24 hours, following the package instructions. 6. What if there is a serious reaction? What should I look for? Look for anything that concerns you, such as signs of a severe allergic reaction, very high fever, or behavior changes. Signs of a severe allergic reaction can include hives, swelling of the face and throat, difficulty breathing, a fast heartbeat, dizziness, and weakness. These would start a few minutes to a few hours after the vaccination. What should I do?  If you think it is a severe allergic reaction or other emergency that can't wait, call 9-1-1 or get the person to the nearest hospital. Otherwise, call your doctor.  Afterward, the reaction should be reported to the Vaccine Adverse Event Reporting System (VAERS). Your doctor might file this report, or you can do it yourself through the VAERS web site at  www.vaers.SamedayNews.es, or by calling  (610) 576-9306. ? VAERS is only for reporting reactions. They do not give medical advice. 7. The National Vaccine Injury Compensation Program The [employer] Vaccine Injury Compensation Program (VICP) is a federal program that was created to compensate people who may have been injured by certain vaccines. Persons who believe they may have been injured by a vaccine can learn about the program and about filing a claim by calling (737)411-0490 or visiting the [employer] website at GoldCloset.com.ee. 8. How can I learn more?  Ask your doctor.  Call your local or state health department.  Contact the Centers for Disease Control and Prevention (CDC): ? Call 838-520-8658 (1-800-CDC-INFO) or ? Visit CDC's website at http://hunter.com/ CDC DTaP Vaccine (Diphtheria, Tetanus, and Pertussis) VIS (06/22/05) This information is not intended to replace advice given to you by your health care provider. Make sure you discuss any questions you have with your health care provider. Document Released: 11/20/2005 Document Revised: 10/14/2015 Document Reviewed: 10/14/2015 Elsevier Interactive Patient Education  2017 [employer].

## 2017-08-28 NOTE — Progress Notes (Signed)
Chief Complaint  Patient presents with  . Follow-up   TOC 1. Bee sting reaction 08/14/17 on left back with throat closing and extensive welps and blistering took benadryl went to [facility] given zantac and prednisone requesting epi pen  2. H/o kidney stones 04/12/17 did not want further w/u with urology Dr. Bernardo Heater 3. Fear of needles and blood therefore declines further labs today and Tdap    Review of Systems  Constitutional: Negative for weight loss.  HENT: Negative for hearing loss.   Eyes: Negative for blurred vision.  Respiratory: Negative for shortness of breath.   Cardiovascular: Negative for chest pain.  Gastrointestinal: Negative for abdominal pain.  Genitourinary: Negative for hematuria.  Musculoskeletal: Negative for back pain.  Skin: Negative for rash.  Neurological: Negative for headaches.  Psychiatric/Behavioral: Negative for depression.   Past Medical History:  Diagnosis Date  . Allergy   . Asthma   . Bee sting reaction    Past Surgical History:  Procedure Laterality Date  . WISDOM TOOTH EXTRACTION Bilateral 2013    Family History  Problem Relation Age of Onset  . Allergies Father   . Allergies Sister   . Arthritis Maternal Grandmother   . Arthritis Paternal Grandmother    Social History   Socioeconomic History  . Marital status: Single    Spouse name: Not on file  . Number of children: Not on file  . Years of education: Not on file  . Highest education level: Not on file  Occupational History  . Not on file  Social Needs  . Financial resource strain: Not on file  . Food insecurity:    Worry: Not on file    Inability: Not on file  . Transportation needs:    Medical: Not on file    Non-medical: Not on file  Tobacco Use  . Smoking status: Never Smoker  . Smokeless tobacco: Never Used  Substance and Sexual Activity  . Alcohol use: Yes    Alcohol/week: 0.0 oz  . Drug use: No  . Sexual activity: Yes    Partners: Female    Comment: 1 partner    Lifestyle  . Physical activity:    Days per week: Not on file    Minutes per session: Not on file  . Stress: Not on file  Relationships  . Social connections:    Talks on phone: Not on file    Gets together: Not on file    Attends religious service: Not on file    Active member of club or organization: Not on file    Attends meetings of clubs or organizations: Not on file    Relationship status: Not on file  . Intimate partner violence:    Fear of current or ex partner: Not on file    Emotionally abused: Not on file    Physically abused: Not on file    Forced sexual activity: Not on file  Other Topics Concern  . 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   Allergies  Allergen Reactions  . Bee Venom     Blisters/rash    No  results found for this or any previous visit (from the past 2160 hour(s)). Objective  Body mass index is 21.03 kg/m. Wt Readings from Last 3 Encounters:  08/28/17 159 lb 6.4 oz (72.3 kg)  04/20/17 161 lb (73 kg)  04/16/17 160 lb (72.6 kg)   Temp Readings from Last 3 Encounters:  08/28/17 98 F (36.7 C) (Oral)  04/12/17 98.1 F (36.7 C) (Oral)  01/11/15 98.4 F (36.9 C) (Oral)   BP Readings from Last 3 Encounters:  08/28/17 120/70  04/20/17 108/62  04/16/17 98/62   Pulse Readings from Last 3 Encounters:  08/28/17 67  04/20/17 80  04/16/17 75    Physical Exam  Constitutional: He is oriented to person, place, and time. Vital signs are normal. He appears well-developed and well-nourished. He is cooperative.  HENT:  Head: Normocephalic and atraumatic.  Mouth/Throat: Oropharynx is clear and moist and mucous membranes are  normal.  Eyes: Pupils are equal, round, and reactive to light. Conjunctivae are normal.  Cardiovascular: Normal rate, regular rhythm and normal heart sounds.  Pulmonary/Chest: Effort normal and breath sounds normal.  Neurological: He is alert and oriented to person, place, and time. Gait normal.  Skin: Skin is warm, dry and intact.  Psychiatric: He has a normal mood and affect. His speech is normal and behavior is normal. Judgment and thought content normal. Cognition and memory are normal.  Nursing note and vitals reviewed.   Assessment   1. Bee sting reaction c/w anaphylaxis  2. Kidney stones with hematuria 3. HM Plan   1. Epipen rec otc antihistamines prn  2. No f/u urology sx's resolved  3.  Declines vaccines flu, Tdap fear of needles  Wants to hold on b/w today TSH, lipid, vit D Declines STD check even urien STD same partner  No smoking Nevi to trunk think about dermatology referral Dr. Brendolyn Patty will let me know   Provider: Dr. Olivia Mackie McLean-Scocuzza-Internal Medicine

## 2017-08-28 NOTE — Progress Notes (Signed)
Pre visit review using our clinic review tool, if applicable. No additional management support is needed unless otherwise documented below in the visit note. 

## 2017-08-29 LAB — URINALYSIS, ROUTINE W REFLEX MICROSCOPIC
BILIRUBIN UA: NEGATIVE
Glucose, UA: NEGATIVE
KETONES UA: NEGATIVE
Leukocytes, UA: NEGATIVE
NITRITE UA: NEGATIVE
PH UA: 7.5 (ref 5.0–7.5)
Protein, UA: NEGATIVE
RBC UA: NEGATIVE
SPEC GRAV UA: 1.015 (ref 1.005–1.030)
UUROB: 0.2 mg/dL (ref 0.2–1.0)

## 2017-08-29 NOTE — Progress Notes (Signed)
All imms have been added to chart.

## 2018-04-12 ENCOUNTER — Telehealth: Payer: Self-pay | Admitting: Pediatrics

## 2018-04-12 ENCOUNTER — Other Ambulatory Visit: Payer: Self-pay | Admitting: Internal Medicine

## 2018-04-12 DIAGNOSIS — Z91030 Bee allergy status: Secondary | ICD-10-CM

## 2018-04-12 MED ORDER — EPINEPHRINE 0.3 MG/0.3ML IJ SOAJ: 0.3000 mg | INTRAMUSCULAR | 0 refills | Status: DC | PRN

## 2018-04-12 NOTE — Telephone Encounter (Signed)
Refill request for EPIPEN 2-PAK however this is not on his medication list.   (There are no meds on his list).   He saw Dr. Terese Door.

## 2018-04-12 NOTE — Telephone Encounter (Signed)
Copied from [place] [id number]. Topic: Quick Communication - Rx Refill/Question >> Apr 12, 2018  8:27 AM Andria Frames L wrote: Medication:EPINEPHrine (EPIPEN 2-PAK) 0.3 mg/0.3 mL IJ SOAJ injection [[id number]]  ENDED   Has the patient contacted their pharmacy? no Preferred Pharmacy (with phone number or street name): [id number] [address], [place] [phone] (Phone) [phone] (Fax)     Incorrect pcp in chart

## 2018-05-24 ENCOUNTER — Other Ambulatory Visit: Payer: Self-pay | Admitting: Internal Medicine

## 2018-05-24 ENCOUNTER — Telehealth: Payer: Self-pay

## 2018-05-24 DIAGNOSIS — Z91030 Bee allergy status: Secondary | ICD-10-CM

## 2018-05-24 MED ORDER — EPINEPHRINE 0.3 MG/0.3ML IJ SOAJ
0.3000 mg | INTRAMUSCULAR | 5 refills | Status: DC | PRN
Start: 2018-05-24 — End: 2023-03-01

## 2018-05-24 NOTE — Telephone Encounter (Signed)
Patient has been notified. He has also been informed that dr Olivia Mackie will not refill any medication unless he is established with her.

## 2018-05-24 NOTE — Telephone Encounter (Signed)
Pt's mother Creola Corn) requested refill on EpiPen for pt to be called into [facility].  CB#:  [phone].

## 2018-05-24 NOTE — Telephone Encounter (Signed)
Sent epipen  Please call to schedule annual exam 7 or 09/2018 am appt fasting so we can do labs  Thanks [place]

## 2018-06-14 ENCOUNTER — Other Ambulatory Visit: Payer: Self-pay

## 2018-06-14 ENCOUNTER — Ambulatory Visit (INDEPENDENT_AMBULATORY_CARE_PROVIDER_SITE_OTHER): Payer: BLUE CROSS/BLUE SHIELD | Admitting: Internal Medicine

## 2018-06-14 DIAGNOSIS — Z13220 Encounter for screening for lipoid disorders: Secondary | ICD-10-CM

## 2018-06-14 DIAGNOSIS — D229 Melanocytic nevi, unspecified: Secondary | ICD-10-CM | POA: Diagnosis not present

## 2018-06-14 DIAGNOSIS — Z1329 Encounter for screening for other suspected endocrine disorder: Secondary | ICD-10-CM | POA: Diagnosis not present

## 2018-06-14 DIAGNOSIS — Z1389 Encounter for screening for other disorder: Secondary | ICD-10-CM

## 2018-06-14 DIAGNOSIS — Z1283 Encounter for screening for malignant neoplasm of skin: Secondary | ICD-10-CM | POA: Diagnosis not present

## 2018-06-14 DIAGNOSIS — Z0000 Encounter for general adult medical examination without abnormal findings: Secondary | ICD-10-CM

## 2018-06-14 DIAGNOSIS — T63441A Toxic effect of venom of bees, accidental (unintentional), initial encounter: Secondary | ICD-10-CM

## 2018-06-14 DIAGNOSIS — E559 Vitamin D deficiency, unspecified: Secondary | ICD-10-CM

## 2018-06-14 NOTE — Progress Notes (Signed)
Call danielle Monday to schedule appointment

## 2018-06-14 NOTE — Progress Notes (Signed)
Virtual Visit via Video Note  I connected with "Jeffery Hope" Cruz   on 06/14/18 at  2:08 PM EDT by a video enabled telemedicine application and verified that I am speaking with the correct person using two identifiers.  Location patient: home Location provider:work  Persons participating in the virtual visit: patient, provider, pts mom Jeffery Cruz [phone]  I discussed the limitations of evaluation and management by telemedicine and the availability of in person appointments. The patient expressed understanding and agreed to proceed.   HPI: 1. H/o allergy to bee stings he was stung winter and used 1 of epi pens now has 3 epi pens left  2. disc'ed need for comprehensive labs pts insurance runs out 10/07/2018 but for now declines though his mom wants to try to talk him into getting labs and Tdap before 10/07/2018   ROS: See pertinent positives and negatives per HPI.  Past Medical History:  Diagnosis Date  . Allergy   . Asthma   . Bee sting reaction     Past Surgical History:  Procedure Laterality Date  . WISDOM TOOTH EXTRACTION Bilateral 2013     Family History  Problem Relation Age of Onset  . Allergies Father   . Allergies Sister   . Arthritis Maternal Grandmother   . 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  EPINEPHrine (EPIPEN 2-PAK) 0.3 mg/0.3 mL IJ SOAJ injection, Inject 0.3 mLs (0.3 mg total) into the muscle as needed for anaphylaxis., Disp: 2 Device, Rfl: 5  EXAM:  VITALS per patient if applicable:  GENERAL: alert, oriented, appears well and in no acute distress  HEENT: atraumatic, conjunttiva clear, no obvious abnormalities on inspection of external nose and ears  NECK: normal movements of the head and neck  LUNGS: on inspection no  signs of respiratory distress, breathing rate appears normal, no obvious gross SOB, gasping or wheezing  CV: no obvious cyanosis  MS: moves all visible extremities without noticeable abnormality  PSYCH/NEURO: pleasant and cooperative, no obvious depression or anxiety, speech and thought processing grossly intact  ASSESSMENT AND PLAN:  Discussed the following assessment and plan:  Multiple nevi - Plan: Ambulatory referral to Dermatology Dr. Gavin Pound sting allergy-epipen prn has 3 pens left   HM Declines vaccines flu, Tdap fear of needles  -mom wants pt to get Tdap and will try to get him to do as well as yearly comp labs b/f insurance runs out 10/07/2018  -pt declines labs for now   Declines STD check even urine STD same partner  No smoking Nevi to trunk think  referral Dr. Evorn Gong    I discussed the assessment and treatment plan with the patient. The patient was provided an opportunity to ask questions and all were answered. The patient agreed with the plan and demonstrated an understanding of the instructions.   The patient was advised to call back or seek an in-person evaluation if the symptoms worsen or if the condition fails to improve as anticipated.  Time spent 15 minutes  Delorise Jackson, MD

## 2018-06-17 NOTE — Progress Notes (Signed)
Noted declines Tdap   TMS

## 2018-06-17 NOTE — Progress Notes (Signed)
Patient declines shot, he will get it when an incident occurs.

## 2018-08-08 ENCOUNTER — Encounter: Payer: Self-pay | Admitting: Emergency Medicine

## 2018-08-08 ENCOUNTER — Other Ambulatory Visit: Payer: Self-pay

## 2018-08-08 ENCOUNTER — Emergency Department
Admission: EM | Admit: 2018-08-08 | Discharge: 2018-08-09 | Disposition: A | Discharge status: Home / Self Care | Payer: BC Managed Care – PPO | Attending: Emergency Medicine | Admitting: Emergency Medicine

## 2018-08-08 DIAGNOSIS — J45909 Unspecified asthma, uncomplicated: Secondary | ICD-10-CM | POA: Insufficient documentation

## 2018-08-08 DIAGNOSIS — L509 Urticaria, unspecified: Secondary | ICD-10-CM | POA: Diagnosis present

## 2018-08-08 DIAGNOSIS — T63441A Toxic effect of venom of bees, accidental (unintentional), initial encounter: Secondary | ICD-10-CM | POA: Insufficient documentation

## 2018-08-08 MED ORDER — DIPHENHYDRAMINE HCL 50 MG/ML IJ SOLN
50.0000 mg | Freq: Once | INTRAMUSCULAR | Status: AC
Start: 2018-08-08 — End: 2018-08-08
  Administered 2018-08-08: 50 mg via INTRAMUSCULAR
  Filled 2018-08-08: qty 1

## 2018-08-08 MED ORDER — FAMOTIDINE 20 MG PO TABS: 20.0000 mg | ORAL_TABLET | Freq: Two times a day (BID) | ORAL | 1 refills | Status: DC

## 2018-08-08 MED ORDER — FAMOTIDINE IN NACL 20-0.9 MG/50ML-% IV SOLN
20.0000 mg | Freq: Once | INTRAVENOUS | Status: AC
  Administered 2018-08-08: 21:00:00 20 mg via INTRAVENOUS
  Filled 2018-08-08: qty 50

## 2018-08-08 MED ORDER — DIPHENHYDRAMINE HCL 25 MG PO TABS: 25.0000 mg | ORAL_TABLET | Freq: Three times a day (TID) | ORAL | 0 refills | Status: DC | PRN

## 2018-08-08 MED ORDER — METHYLPREDNISOLONE SODIUM SUCC 125 MG IJ SOLR
125.0000 mg | Freq: Once | INTRAMUSCULAR | Status: AC
  Administered 2018-08-08: 21:00:00 125 mg via INTRAMUSCULAR
  Filled 2018-08-08: qty 2

## 2018-08-08 MED ORDER — SODIUM CHLORIDE 0.9 % IV BOLUS
1000.0000 mL | Freq: Once | INTRAVENOUS | Status: AC
  Administered 2018-08-08: 1000 mL via INTRAVENOUS

## 2018-08-08 MED ORDER — PREDNISONE 50 MG PO TABS: ORAL_TABLET | ORAL | 0 refills | Status: DC

## 2018-08-08 NOTE — ED Provider Notes (Signed)
[facility] Emergency Department Provider Note  ____________________________________________  Time seen: Approximately 8:36 PM  I have reviewed the triage vital signs and the nursing notes.   HISTORY  Chief Complaint Allergic Reaction    HPI Jeffery Cruz is a 28 y.o. male who presents the emergency department complaining of allergic reaction to bee sting.  Patient has a history of bee sting reaction with what appears to be anaphylactic-like symptoms.  Patient has been prescribed an EpiPen should he be stung.  Patient was stung on his back, developed immediate hives.  Patient uses EpiPen and took 50 mg of Benadryl an hour prior to arrival.  Patient reports during this episode he has had no shortness of breath, wheezing, feeling like his throat was closing.  Patient has developed hives to his back, torso, bilateral upper extremities.  Patient reports that these hives have been improving after taking medication at home.  Patient states that he presents for further evaluation to ensure no worsening of his symptoms.         Past Medical History:  Diagnosis Date  . Allergy   . Asthma   . Bee sting reaction     Patient Active Problem List   Diagnosis Date Noted  . Multiple nevi 06/14/2018  . Bee sting reaction 08/28/2017  . Kidney stone 04/12/2017  . Bilateral thoracic back pain 01/11/2015  . Left shoulder pain 09/09/2014  . Encounter to establish care 08/11/2014  . Insomnia 08/11/2014  . H/O extrinsic asthma 08/11/2014  . Environmental allergies 08/11/2014    Past Surgical History:  Procedure Laterality Date  . WISDOM TOOTH EXTRACTION Bilateral 2013     Prior to Admission medications   Medication Sig Start Date End Date Taking? Authorizing Provider  EPINEPHrine (EPIPEN 2-PAK) 0.3 mg/0.3 mL IJ SOAJ injection Inject 0.3 mLs (0.3 mg total) into the muscle as needed for anaphylaxis. 05/24/18   McLean-Scocuzza, Nino Glow, MD    Allergies Bee  venom  Family History  Problem Relation Age of Onset  . Allergies Father   . Allergies Sister   . Arthritis Maternal Grandmother   . Arthritis Paternal Grandmother     Social History Social History   Tobacco Use  . Smoking status: Never Smoker  . Smokeless tobacco: Never Used  Substance Use Topics  . Alcohol use: Yes    Alcohol/week: 0.0 standard drinks  . Drug use: No     Review of Systems  Constitutional: No fever/chills Eyes: No visual changes. No discharge ENT: No upper respiratory complaints.  Denies any sensation of throat closing or swelling Cardiovascular: no chest pain. Respiratory: no cough. No SOB. Gastrointestinal: No abdominal pain.  No nausea, no vomiting.  No diarrhea.  No constipation. Musculoskeletal: Negative for musculoskeletal pain. Skin: Diffuse hives after bee sting Neurological: Negative for headaches, focal weakness or numbness. 10-point ROS otherwise negative.  ____________________________________________   PHYSICAL EXAM:  VITAL SIGNS: ED Triage Vitals  Enc Vitals Group     BP 08/08/18 2029 133/84     Pulse Rate 08/08/18 2029 (!) 108     Resp 08/08/18 2029 (!) 22     Temp 08/08/18 2029 98.4 F (36.9 C)     Temp Source 08/08/18 2029 Oral     SpO2 08/08/18 2029 95 %     Weight 08/08/18 2031 140 lb (63.5 kg)     Height 08/08/18 2031 6\' 1"  (1.854 m)     Head Circumference --      Peak Flow --  Pain Score 08/08/18 2030 0     Pain Loc --      Pain Edu? --      Excl. in [place]? --      Constitutional: Alert and oriented. Well appearing and in no acute distress. Eyes: Conjunctivae are normal. PERRL. EOMI. Head: Atraumatic. ENT:      Ears:       Nose: No congestion/rhinnorhea.      Mouth/Throat: Mucous membranes are moist.  Oropharynx is nonerythematous and nonedematous.  No oropharyngeal edema.  No angioedema. Neck: No stridor.    Cardiovascular: Normal rate, regular rhythm. Normal S1 and S2.  Good peripheral  circulation. Respiratory: Normal respiratory effort without tachypnea or retractions. Lungs CTAB. Good air entry to the bases with no decreased or absent breath sounds. Musculoskeletal: Full range of motion to all extremities. No gross deformities appreciated. Neurologic:  Normal speech and language. No gross focal neurologic deficits are appreciated.  Skin:  Skin is warm, dry and intact.  Scattered hives noted to the torso, bilateral upper extremities.  No perioral or periocular involvement. Psychiatric: Mood and affect are normal. Speech and behavior are normal. Patient exhibits appropriate insight and judgement.   ____________________________________________   LABS (all labs ordered are listed, but only abnormal results are displayed)  Labs Reviewed - No data to display ____________________________________________  EKG   ____________________________________________  RADIOLOGY   No results found.  ____________________________________________    PROCEDURES  Procedure(s) performed:    Procedures    Medications  methylPREDNISolone sodium succinate (SOLU-MEDROL) 125 mg/2 mL injection 125 mg (has no administration in time range)  diphenhydrAMINE (BENADRYL) injection 50 mg (has no administration in time range)  famotidine (PEPCID) IVPB 20 mg premix (has no administration in time range)  sodium chloride 0.9 % bolus 1,000 mL (has no administration in time range)     ____________________________________________   INITIAL IMPRESSION / ASSESSMENT AND PLAN / ED COURSE  Pertinent labs & imaging results that were available during my care of the patient were reviewed by me and considered in my medical decision making (see chart for details).  Review of the [place] CSRS was performed in accordance of the [place] prior to dispensing any controlled drugs.           Patient's diagnosis is consistent with bee sting, bee sting allergy.  Patient presents emergency department for  evaluation of allergic reaction following bee sting.  Patient is highly allergic to bee stings, carries an EpiPen for same.  Patient took his EpiPen and 50 mg of Benadryl prior to arrival.  Patient has had no airway complaints.  Only symptom has been diffuse hives.  These are improving after initial medication.  The patient significant history of reaction to bee stings, patient will be placed on fluids, IV steroid, diphenhydramine, famotidine..  At this time, patient will be evaluated for the next several hours as he used epinephrine prior to arrival as well as to ensure no worsening of symptoms.  At this time patient care will be transferred to follow provider, Vallarie Mare, PA-C while patient awaits this time for discharge.    ____________________________________________  FINAL CLINICAL IMPRESSION(S) / ED DIAGNOSES  Final diagnoses:  Bee sting reaction, accidental or unintentional, initial encounter      NEW MEDICATIONS STARTED DURING THIS VISIT:  ED Discharge Orders    None          This chart was dictated using voice recognition software/Dragon. Despite best efforts to proofread, errors can occur which can change  the meaning. Any change was purely unintentional.    Brynda Peon 08/08/18 2049    Carrie Mew, MD 08/15/18 1351

## 2018-08-08 NOTE — ED Provider Notes (Signed)
  Physical Exam  BP 133/84 (BP Location: Left Arm)   Pulse (!) 108   Temp 98.4 F (36.9 C) (Oral)   Resp (!) 22   Ht 6\' 1"  (1.854 m)   Wt 63.5 kg   SpO2 95%   BMI 18.47 kg/m   Physical Exam  ED Course/Procedures     Procedures  MDM  ----------------------------------------- 9:39 PM on 08/08/2018 -----------------------------------------  Checked on patient.  Hives seem to be improving.  Patient is resting comfortably.    ----------------------------------------- 12:06 AM on 08/09/2018 -----------------------------------------  Hives have completely resolved and patient is requesting to go home.  Patient was discharged with prednisone, famotidine and Benadryl.  He was given strict return precautions to return to the emergency department with shortness of breath, chest tightness, abdominal discomfort, diarrhea, vomiting or syncope.  He voiced understanding and has easy access to the emergency department should his symptoms worsen.  All patient questions were answered.       Vallarie Mare [place], PA-C 08/09/18 Ileene Musa    Delman Kitten, MD 08/09/18 [phone]

## 2018-08-08 NOTE — ED Triage Notes (Signed)
Pt arrived to ED via private auto after he was stung by a wasp approx 45 min ago. Pt states he is allergic and did take 2 benadryl tablets and used his epi pen on his right leg within 5 min of being stung. Clearing his throat often during triage. Hives present. No obvious swelling to tongue and lips. Nasal congestion present. Denies sob or difficulty swallowing.

## 2018-08-08 NOTE — Discharge Instructions (Addendum)
You have been diagnosed with an allergic reaction. Take Benadryl 3 times daily for the next 5 days. Take Pepcid twice daily for the next 5 days. Take prednisone once daily for the next 5 days. Return to the emergency department with shortness of breath, chest tightness, difficulty swallowing, vomiting, diarrhea or passing out.

## 2018-08-09 NOTE — ED Notes (Signed)
Patient discharged to home per MD order. Patient in stable condition, and deemed medically cleared by ED provider for discharge. Discharge instructions reviewed with patient/family using "Teach Back"; verbalized understanding of medication education and administration, and information about follow-up care. Denies further concerns. ° °

## 2018-10-29 DIAGNOSIS — S0633AA Contusion and laceration of cerebrum, unspecified, with loss of consciousness status unknown, initial encounter: Secondary | ICD-10-CM | POA: Insufficient documentation

## 2018-10-29 DIAGNOSIS — S060X9A Concussion with loss of consciousness of unspecified duration, initial encounter: Secondary | ICD-10-CM | POA: Insufficient documentation

## 2018-10-29 DIAGNOSIS — S06339A Contusion and laceration of cerebrum, unspecified, with loss of consciousness of unspecified duration, initial encounter: Secondary | ICD-10-CM | POA: Insufficient documentation

## 2018-10-29 DIAGNOSIS — S0219XA Other fracture of base of skull, initial encounter for closed fracture: Secondary | ICD-10-CM | POA: Insufficient documentation

## 2018-11-26 ENCOUNTER — Encounter: Payer: Self-pay | Admitting: Internal Medicine

## 2018-11-26 ENCOUNTER — Other Ambulatory Visit: Payer: Self-pay

## 2018-11-26 ENCOUNTER — Ambulatory Visit (INDEPENDENT_AMBULATORY_CARE_PROVIDER_SITE_OTHER): Payer: 59 | Admitting: Internal Medicine

## 2018-11-26 VITALS — BP 118/76 | HR 67 | Temp 97.9°F | Ht 73.0 in | Wt 150.2 lb

## 2018-11-26 DIAGNOSIS — T1490XA Injury, unspecified, initial encounter: Secondary | ICD-10-CM

## 2018-11-26 DIAGNOSIS — N281 Cyst of kidney, acquired: Secondary | ICD-10-CM | POA: Diagnosis not present

## 2018-11-26 DIAGNOSIS — G4700 Insomnia, unspecified: Secondary | ICD-10-CM

## 2018-11-26 DIAGNOSIS — M25512 Pain in left shoulder: Secondary | ICD-10-CM | POA: Diagnosis not present

## 2018-11-26 DIAGNOSIS — R5383 Other fatigue: Secondary | ICD-10-CM

## 2018-11-26 MED ORDER — CYCLOBENZAPRINE HCL 5 MG PO TABS: 5.0000 mg | ORAL_TABLET | Freq: Every evening | ORAL | 0 refills | Status: DC | PRN

## 2018-11-26 NOTE — Patient Instructions (Addendum)
Multivitamin with iron (Centrum, nature made, vitafusion chewables)   Dr. Normaj[facility]  [facility]  Vitamin D in multivitamin but might need to take extra to equal total of 4000 IU daily    Vitamin D Deficiency Vitamin D deficiency is when your body does not have enough vitamin D. Vitamin D is important to your body for many reasons:  It helps the body absorb two important minerals-calcium and phosphorus.  It plays a role in bone health.  It may help to prevent some diseases, such as diabetes and multiple sclerosis.  It plays a role in muscle function, including heart function. If vitamin D deficiency is severe, it can cause a condition in which your bones become soft. In adults, this condition is called osteomalacia. In children, this condition is called rickets. What are the causes? This condition may be caused by:  Not eating enough foods that contain vitamin D.  Not getting enough natural sun exposure.  Having certain digestive system diseases that make it difficult for your body to absorb vitamin D. These diseases include Crohn's disease, chronic pancreatitis, and cystic fibrosis.  Having a surgery in which a part of the stomach or a part of the small intestine is removed.  Having chronic kidney disease or liver disease. What increases the risk? You are more likely to develop this condition if you:  Are older.  Do not spend much time outdoors.  Live in a long-term care facility.  Have had broken bones.  Have weak or thin bones (osteoporosis).  Have a disease or condition that changes how the body absorbs vitamin D.  Have dark skin.  Take certain medicines, such as steroid medicines or certain seizure medicines.  Are overweight or obese. What are the signs or symptoms? In mild cases of vitamin D deficiency, there may not be any symptoms. If the condition is severe, symptoms may include:  Bone pain.  Muscle pain.  Falling  often.  Broken bones caused by a minor injury. How is this diagnosed? This condition may be diagnosed with blood tests. Imaging tests such as X-rays may also be done to look for changes in the bone. How is this treated? Treatment for this condition may depend on what caused the condition. Treatment options include:  Taking vitamin D supplements. Your health care provider will suggest what dose is best for you.  Taking a calcium supplement. Your health care provider will suggest what dose is best for you. Follow these instructions at home: Eating and drinking   Eat foods that contain vitamin D. Choices include: ? Fortified dairy products, cereals, or juices. Fortified means that vitamin D has been added to the food. Check the label on the package to see if the food is fortified. ? Fatty fish, such as salmon or trout. ? Eggs. ? Oysters. ? Mushrooms. The items listed above may not be a complete list of recommended foods and beverages. Contact a dietitian for more information. General instructions  Take medicines and supplements only as told by your health care provider.  Get regular, safe exposure to natural sunlight.  Do not use a tanning bed.  Maintain a healthy weight. Lose weight if needed.  Keep all follow-up visits as told by your health care provider. This is important. How is this prevented? You can get vitamin D by:  Eating foods that naturally contain vitamin D.  Eating or drinking products that have been fortified with vitamin D, such as cereals, juices, and dairy  products (including milk).  Taking a vitamin D supplement or a multivitamin supplement that contains vitamin D.  Being in the sun. Your body naturally makes vitamin D when your skin is exposed to sunlight. Your body changes the sunlight into a form of the vitamin that it can use. Contact a health care provider if:  Your symptoms do not go away.  You feel nauseous or you vomit.  You have fewer bowel  movements than usual or are constipated. Summary  Vitamin D deficiency is when your body does not have enough vitamin D.  Vitamin D is important to your body for good bone health and muscle function, and it may help prevent some diseases.  Vitamin D deficiency is primarily treated through supplementation. Your health care provider will suggest what dose is best for you.  You can get vitamin D by eating foods that contain vitamin D, by being in the sun, and by taking a vitamin D supplement or a multivitamin supplement that contains vitamin D. This information is not intended to replace advice given to you by your health care provider. Make sure you discuss any questions you have with your health care provider. Document Released: 04/17/2011 Document Revised: 10/01/2017 Document Reviewed: 10/01/2017 Elsevier Patient Education  2020 [place].    Shoulder Exercises Ask your health care provider which exercises are safe for you. Do exercises exactly as told by your health care provider and adjust them as directed. It is normal to feel mild stretching, pulling, tightness, or discomfort as you do these exercises. Stop right away if you feel sudden pain or your pain gets worse. Do not begin these exercises until told by your health care provider. Stretching exercises External rotation and abduction This exercise is sometimes called corner stretch. This exercise rotates your arm outward (external rotation) and moves your arm out from your body (abduction). 1. Stand in a doorway with one of your feet slightly in front of the other. This is called a staggered stance. If you cannot reach your forearms to the door frame, stand facing a corner of a room. 2. Choose one of the following positions as told by your health care provider: ? Place your hands and forearms on the door frame above your head. ? Place your hands and forearms on the door frame at the height of your head. ? Place your hands on the  door frame at the height of your elbows. 3. Slowly move your weight onto your front foot until you feel a stretch across your chest and in the front of your shoulders. Keep your head and chest upright and keep your abdominal muscles tight. 4. Hold for __________ seconds. 5. To release the stretch, shift your weight to your back foot. Repeat __________ times. Complete this exercise __________ times a day. Extension, standing 1. Stand and hold a broomstick, a cane, or a similar object behind your back. ? Your hands should be a little wider than shoulder width apart. ? Your palms should face away from your back. 2. Keeping your elbows straight and your shoulder muscles relaxed, move the stick away from your body until you feel a stretch in your shoulders (extension). ? Avoid shrugging your shoulders while you move the stick. Keep your shoulder blades tucked down toward the middle of your back. 3. Hold for __________ seconds. 4. Slowly return to the starting position. Repeat __________ times. Complete this exercise __________ times a day. Range-of-motion exercises Pendulum  1. Stand near a wall or a surface  that you can hold onto for balance. 2. Bend at the waist and let your left / right arm hang straight down. Use your other arm to support you. Keep your back straight and do not lock your knees. 3. Relax your left / right arm and shoulder muscles, and move your hips and your trunk so your left / right arm swings freely. Your arm should swing because of the motion of your body, not because you are using your arm or shoulder muscles. 4. Keep moving your hips and trunk so your arm swings in the following directions, as told by your health care provider: ? Side to side. ? Forward and backward. ? In clockwise and counterclockwise circles. 5. Continue each motion for __________ seconds, or for as long as told by your health care provider. 6. Slowly return to the starting position. Repeat __________  times. Complete this exercise __________ times a day. Shoulder flexion, standing  1. Stand and hold a broomstick, a cane, or a similar object. Place your hands a little more than shoulder width apart on the object. Your left / right hand should be palm up, and your other hand should be palm down. 2. Keep your elbow straight and your shoulder muscles relaxed. Push the stick up with your healthy arm to raise your left / right arm in front of your body, and then over your head until you feel a stretch in your shoulder (flexion). ? Avoid shrugging your shoulder while you raise your arm. Keep your shoulder blade tucked down toward the middle of your back. 3. Hold for __________ seconds. 4. Slowly return to the starting position. Repeat __________ times. Complete this exercise __________ times a day. Shoulder abduction, standing 1. Stand and hold a broomstick, a cane, or a similar object. Place your hands a little more than shoulder width apart on the object. Your left / right hand should be palm up, and your other hand should be palm down. 2. Keep your elbow straight and your shoulder muscles relaxed. Push the object across your body toward your left / right side. Raise your left / right arm to the side of your body (abduction) until you feel a stretch in your shoulder. ? Do not raise your arm above shoulder height unless your health care provider tells you to do that. ? If directed, raise your arm over your head. ? Avoid shrugging your shoulder while you raise your arm. Keep your shoulder blade tucked down toward the middle of your back. 3. Hold for __________ seconds. 4. Slowly return to the starting position. Repeat __________ times. Complete this exercise __________ times a day. Internal rotation  1. Place your left / right hand behind your back, palm up. 2. Use your other hand to dangle an exercise band, a towel, or a similar object over your shoulder. Grasp the band with your left / right hand  so you are holding on to both ends. 3. Gently pull up on the band until you feel a stretch in the front of your left / right shoulder. The movement of your arm toward the center of your body is called internal rotation. ? Avoid shrugging your shoulder while you raise your arm. Keep your shoulder blade tucked down toward the middle of your back. 4. Hold for __________ seconds. 5. Release the stretch by letting go of the band and lowering your hands. Repeat __________ times. Complete this exercise __________ times a day. Strengthening exercises External rotation  1. Sit in a stable  chair without armrests. 2. Secure an exercise band to a stable object at elbow height on your left / right side. 3. Place a soft object, such as a folded towel or a small pillow, between your left / right upper arm and your body to move your elbow about 4 inches (10 cm) away from your side. 4. Hold the end of the exercise band so it is tight and there is no slack. 5. Keeping your elbow pressed against the soft object, slowly move your forearm out, away from your abdomen (external rotation). Keep your body steady so only your forearm moves. 6. Hold for __________ seconds. 7. Slowly return to the starting position. Repeat __________ times. Complete this exercise __________ times a day. Shoulder abduction  1. Sit in a stable chair without armrests, or stand up. 2. Hold a __________ weight in your left / right hand, or hold an exercise band with both hands. 3. Start with your arms straight down and your left / right palm facing in, toward your body. 4. Slowly lift your left / right hand out to your side (abduction). Do not lift your hand above shoulder height unless your health care provider tells you that this is safe. ? Keep your arms straight. ? Avoid shrugging your shoulder while you do this movement. Keep your shoulder blade tucked down toward the middle of your back. 5. Hold for __________ seconds. 6. Slowly  lower your arm, and return to the starting position. Repeat __________ times. Complete this exercise __________ times a day. Shoulder extension 1. Sit in a stable chair without armrests, or stand up. 2. Secure an exercise band to a stable object in front of you so it is at shoulder height. 3. Hold one end of the exercise band in each hand. Your palms should face each other. 4. Straighten your elbows and lift your hands up to shoulder height. 5. Step back, away from the secured end of the exercise band, until the band is tight and there is no slack. 6. Squeeze your shoulder blades together as you pull your hands down to the sides of your thighs (extension). Stop when your hands are straight down by your sides. Do not let your hands go behind your body. 7. Hold for __________ seconds. 8. Slowly return to the starting position. Repeat __________ times. Complete this exercise __________ times a day. Shoulder row 1. Sit in a stable chair without armrests, or stand up. 2. Secure an exercise band to a stable object in front of you so it is at waist height. 3. Hold one end of the exercise band in each hand. Position your palms so that your thumbs are facing the ceiling (neutral position). 4. Bend each of your elbows to a 90-degree angle (right angle) and keep your upper arms at your sides. 5. Step back until the band is tight and there is no slack. 6. Slowly pull your elbows back behind you. 7. Hold for __________ seconds. 8. Slowly return to the starting position. Repeat __________ times. Complete this exercise __________ times a day. Shoulder press-ups  1. Sit in a stable chair that has armrests. Sit upright, with your feet flat on the floor. 2. Put your hands on the armrests so your elbows are bent and your fingers are pointing forward. Your hands should be about even with the sides of your body. 3. Push down on the armrests and use your arms to lift yourself off the chair. Straighten your  elbows and lift yourself up  as much as you comfortably can. ? Move your shoulder blades down, and avoid letting your shoulders move up toward your ears. ? Keep your feet on the ground. As you get stronger, your feet should support less of your body weight as you lift yourself up. 4. Hold for __________ seconds. 5. Slowly lower yourself back into the chair. Repeat __________ times. Complete this exercise __________ times a day. Wall push-ups  1. Stand so you are facing a stable wall. Your feet should be about one arm-length away from the wall. 2. Lean forward and place your palms on the wall at shoulder height. 3. Keep your feet flat on the floor as you bend your elbows and lean forward toward the wall. 4. Hold for __________ seconds. 5. Straighten your elbows to push yourself back to the starting position. Repeat __________ times. Complete this exercise __________ times a day. This information is not intended to replace advice given to you by your health care provider. Make sure you discuss any questions you have with your health care provider. Document Released: 12/07/2004 Document Revised: 05/17/2018 Document Reviewed: 02/22/2018 Elsevier Patient Education  2020 [place].  Shoulder Range of Motion Exercises Shoulder range of motion (ROM) exercises are done to keep the shoulder moving freely or to increase movement. They are often recommended for people who have shoulder pain or stiffness or who are recovering from a shoulder surgery. Phase 1 exercises When you are able, do this exercise 1-2 times per day for 30-60 seconds in each direction, or as directed by your health care provider. Pendulum exercise To do this exercise while sitting: 1. Sit in a chair or at the edge of your bed with your feet flat on the floor. 2. Let your affected arm hang down in front of you over the edge of the bed or chair. 3. Relax your shoulder, arm, and hand. [place] your body so your arm gently swings in  small circles. You can also use your unaffected arm to start the motion. 5. Repeat changing the direction of the circles, swinging your arm left and right, and swinging your arm forward and back. To do this exercise while standing: 1. Stand next to a sturdy chair or table, and hold on to it with your hand on your unaffected side. 2. Bend forward at the waist. 3. Bend your knees slightly. 4. Relax your shoulder, arm, and hand. 5. While keeping your shoulder relaxed, use body motion to swing your arm in small circles. 6. Repeat changing the direction of the circles, swinging your arm left and right, and swinging your arm forward and back. 7. Between exercises, stand up tall and take a short break to relax your lower back.  Phase 2 exercises Do these exercises 1-2 times per day or as told by your health care provider. Hold each stretch for 30 seconds, and repeat 3 times. Do the exercises with one or both arms as instructed by your health care provider. For these exercises, sit at a table with your hand and arm supported by the table. A chair that slides easily or has wheels can be helpful. External rotation 1. Turn your chair so that your affected side is nearest to the table. 2. Place your forearm on the table to your side. Bend your elbow about 90 at the elbow (right angle) and place your hand palm facing down on the table. Your elbow should be about 6 inches away from your side. 3. Keeping your arm on the table,  lean your body forward. Abduction 1. Turn your chair so that your affected side is nearest to the table. 2. Place your forearm and hand on the table so that your thumb points toward the ceiling and your arm is straight out to your side. 3. Slide your hand out to the side and away from you, using your unaffected arm to do the work. 4. To increase the stretch, you can slide your chair away from the table. Flexion: forward stretch 1. Sit facing the table. Place your hand and elbow on  the table in front of you. 2. Slide your hand forward and away from you, using your unaffected arm to do the work. 3. To increase the stretch, you can slide your chair backward. Phase 3 exercises Do these exercises 1-2 times per day or as told by your health care provider. Hold each stretch for 30 seconds, and repeat 3 times. Do the exercises with one or both arms as instructed by your health care provider. Cross-body stretch: posterior capsule stretch 1. Lift your arm straight out in front of you. 2. Bend your arm 90 at the elbow (right angle) so your forearm moves across your body. 3. Use your other arm to gently pull the elbow across your body, toward your other shoulder. Wall climbs 1. Stand with your affected arm extended out to the side with your hand resting on a door frame. 2. Slide your hand slowly up the door frame. 3. To increase the stretch, step through the door frame. Keep your body upright and do not lean. Wand exercises You will need a cane, a piece of PVC pipe, or a sturdy wooden dowel for wand exercises. Flexion To do this exercise while standing: 1. Hold the wand with both of your hands, palms down. 2. Using the other arm to help, lift your arms up and over your head, if able. 3. Push upward with your other arm to gently increase the stretch. To do this exercise while lying down: 1. Lie on your back with your elbows resting on the floor and the wand in both your hands. Your hands will be palm down, or pointing toward your feet. 2. Lift your hands toward the ceiling, using your unaffected arm to help if needed. 3. Bring your arms overhead as able, using your unaffected arm to help if needed. Internal rotation 1. Stand while holding the wand behind you with both hands. Your unaffected arm should be extended above your head with the arm of the affected side extended behind you at the level of your waist. The wand should be pointing straight up and down as you hold it. 2.  Slowly pull the wand up behind your back by straightening the elbow of your unaffected arm and bending the elbow of your affected arm. External rotation 1. Lie on your back with your affected upper arm supported on a small pillow or rolled towel. When you first do this exercise, keep your upper arm close to your body. Over time, bring your arm up to a 90 angle out to the side. 2. Hold the wand across your stomach and with both hands palm up. Your elbow on your affected side should be bent at a 90 angle. 3. Use your unaffected side to help push your forearm away from you and toward the floor. Keep your elbow on your affected side bent at a 90 angle. Contact a health care provider if you have:  New or increasing pain.  New numbness, tingling, weakness,  or discoloration in your arm or hand. This information is not intended to replace advice given to you by your health care provider. Make sure you discuss any questions you have with your health care provider. Document Released: 10/22/2002 Document Revised: 03/07/2017 Document Reviewed: 03/07/2017 Elsevier Patient Education  2020 [employer].
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
## 2018-12-13 ENCOUNTER — Other Ambulatory Visit
Admission: RE | Admit: 2018-12-13 | Discharge: 2018-12-13 | Disposition: A | Discharge status: Home / Self Care | Payer: 59 | Attending: Family Medicine | Admitting: Family Medicine

## 2018-12-13 ENCOUNTER — Ambulatory Visit (INDEPENDENT_AMBULATORY_CARE_PROVIDER_SITE_OTHER): Payer: 59 | Admitting: Family Medicine

## 2018-12-13 ENCOUNTER — Encounter: Payer: Self-pay | Admitting: Family Medicine

## 2018-12-13 ENCOUNTER — Ambulatory Visit: Payer: Self-pay

## 2018-12-13 DIAGNOSIS — N200 Calculus of kidney: Secondary | ICD-10-CM | POA: Insufficient documentation

## 2018-12-13 DIAGNOSIS — R109 Unspecified abdominal pain: Secondary | ICD-10-CM

## 2018-12-13 LAB — BASIC METABOLIC PANEL
Anion gap: 9 (ref 5–15)
BUN: 12 mg/dL (ref 6–20)
CO2: 27 mmol/L (ref 22–32)
Calcium: 8.7 mg/dL — ABNORMAL LOW (ref 8.9–10.3)
Chloride: 101 mmol/L (ref 98–111)
Creatinine, Ser: 1.41 mg/dL — ABNORMAL HIGH (ref 0.61–1.24)
GFR calc Af Amer: >60 mL/min (ref >60)
GFR calc non Af Amer: >60 mL/min (ref >60)
Glucose, Bld: 78 mg/dL (ref 70–99)
Potassium: 3.8 mmol/L (ref 3.5–5.1)
Sodium: 137 mmol/L (ref 135–145)

## 2018-12-13 LAB — CBC
HCT: 36.4 % — ABNORMAL LOW (ref 39.0–52.0)
Hemoglobin: 12.9 g/dL — ABNORMAL LOW (ref 13.0–17.0)
MCH: 31.2 pg (ref 26.0–34.0)
MCHC: 35.4 g/dL (ref 30.0–36.0)
MCV: 87.9 fL (ref 80.0–100.0)
Platelets: 218 10*3/uL (ref 150–400)
RBC: 4.14 MIL/uL — ABNORMAL LOW (ref 4.22–5.81)
RDW: 12.3 % (ref 11.5–15.5)
WBC: 9.2 10*3/uL (ref 4.0–10.5)
nRBC: 0 % (ref 0.0–0.2)

## 2018-12-13 MED ORDER — HYDROCODONE-ACETAMINOPHEN 10-325 MG PO TABS: 1.0000 | ORAL_TABLET | Freq: Three times a day (TID) | ORAL | 0 refills | Status: AC | PRN

## 2018-12-13 NOTE — Telephone Encounter (Signed)
Mom reports pt. Diagnosed with right kidney stone Tuesday. Has run out of pain medications. Pain is 10/10. Right flank. Passing urine without difficulty. Warm transfer to [place] in the practice for a visit.  Answer Assessment - Initial Assessment Questions 1. LOCATION: "Where does it hurt?" (e.g., left, right)     Right sided flank pain 2. ONSET: "When did the pain start?"     Started Tuesday 3. SEVERITY: "How bad is the pain?" (e.g., Scale 1-10; mild, moderate, or severe)   - MILD (1-3): doesn't interfere with normal activities    - MODERATE (4-7): interferes with normal activities or awakens from sleep    - SEVERE (8-10): excruciating pain and patient unable to do normal activities (stays in bed)       10 4. PATTERN: "Does the pain come and go, or is it constant?"      Constant 5. CAUSE: "What do you think is causing the pain?"     Kidney stone 6. OTHER SYMPTOMS:  "Do you have any other symptoms?" (e.g., fever, abdominal pain, vomiting, leg weakness, burning with urination, blood in urine)     No 7. PREGNANCY:  "Is there any chance you are pregnant?" "When was your last menstrual period?"     N/A  Protocols used: FLANK PAIN-A-AH

## 2018-12-13 NOTE — Progress Notes (Signed)
Patient ID: Jeffery Cruz, male   DOB: 1990-09-08, 28 y.o.   MRN: [id number]    Virtual Visit via video/phone Note  This visit type was conducted due to national recommendations for restrictions regarding the COVID-19 pandemic (e.g. social distancing).  This format is felt to be most appropriate for this patient at this time.  All issues noted in this document were discussed and addressed.  No physical exam was performed (except for noted visual exam findings with Video Visits).   I connected with Lillie Fragmin and mom today at 10:20 AM EST by a video enabled telemedicine application or telephone and verified that I am speaking with the correct person using two identifiers. Location patient: home Location provider: work or home office Persons participating in the virtual visit: patient, provider, mom  I discussed the limitations, risks, security and privacy concerns of performing an evaluation and management service by telephone and the availability of in person appointments. I also discussed with the patient that there may be a patient responsible charge related to this service. The patient expressed understanding and agreed to proceed.  Interactive audio and video telecommunications were attempted between this provider and patient, however failed, due to patient having technical difficulties. Finished visit over the phone.    HPI:  Patient, mom and I connected via video then via phone to discuss right flank pain and kidney stone treatment.  I was able to see patient over video feed, but we are having difficult time hearing each other so this is why visit was completed over phone.  Patient was seen at [facility] ER on 12/10/2018 diagnosed with right kidney stone.  CBC and metabolic panel done at that time did not show any elevated white cells or abnormal kidney functions.  He was prescribed pain medicine and sent home with urology referral.  He is seen urology on Monday 11/9.  Mom states patient  is in severe pain and at times will be doubled over due to right flank pain.  States he is passing urine without problems, but is in a lot of pain.  No vomiting or diarrhea.  No fevers or chills.   ROS: See pertinent positives and negatives per HPI.  Past Medical History:  Diagnosis Date  . Allergy   . Asthma   . Bee sting reaction     Past Surgical History:  Procedure Laterality Date  . WISDOM TOOTH EXTRACTION Bilateral 2013     Family History  Problem Relation Age of Onset  . Allergies Father   . Allergies Sister   . Arthritis Maternal Grandmother   . Arthritis Paternal Grandmother    Social History   Tobacco Use  . Smoking status: Never Smoker  . Smokeless tobacco: Never Used  Substance Use Topics  . Alcohol use: Yes    Alcohol/week: 0.0 standard drinks    Current Outpatient Medications:  .  cyclobenzaprine (FLEXERIL) 5 MG tablet, Take 1 tablet (5 mg total) by mouth at bedtime as needed for muscle spasms., Disp: 30 tablet, Rfl: 0 .  EPINEPHrine (EPIPEN 2-PAK) 0.3 mg/0.3 mL IJ SOAJ injection, Inject 0.3 mLs (0.3 mg total) into the muscle as needed for anaphylaxis., Disp: 2 Device, Rfl: 5 .  HYDROcodone-acetaminophen (NORCO/VICODIN) 5-325 MG tablet, Take by mouth., Disp: , Rfl:  .  ondansetron (ZOFRAN-ODT) 4 MG disintegrating tablet, Take by mouth., Disp: , Rfl:  .  tamsulosin (FLOMAX) 0.4 MG CAPS capsule, Take by mouth., Disp: , Rfl:   EXAM:  GENERAL: alert, oriented, appears in  pain  HEENT: atraumatic, conjunttiva clear, no obvious abnormalities on inspection of external nose and ears  NECK: normal movements of the head and neck  LUNGS: on inspection no signs of respiratory distress, breathing rate appears normal, no obvious gross SOB, gasping or wheezing  CV: no obvious cyanosis  MS: moves all visible extremities without noticeable abnormality  PSYCH/NEURO: pleasant and cooperative, no obvious depression or anxiety. ASSESSMENT AND PLAN:  Discussed the  following assessment and plan:  Right nephrolithiasis - Plan: Basic metabolic panel, CBC, HYDROcodone-acetaminophen (NORCO) 10-325 MG tablet  Right flank pain - Plan: Basic metabolic panel, CBC, HYDROcodone-acetaminophen (NORCO) 10-325 MG tablet  I will prescribe patient some more pain medicine.  PMP registry checked and is appropriate for prescription for pain medicine to get him through until urology appointment.  He will get stat labs drawn today at hospital of CBC and metabolic panel so we can be sure he is not having any elevations in white cells or decreasing kidney functions.  Advised both mom and patient if pain does come worse or severe over the weekend they must go to emergency room for evaluation.  With kidney stones we are always very concerned for injury to the kidneys.  Mom verbalizes understanding.   I discussed the assessment and treatment plan with the patient. The patient was provided an opportunity to ask questions and all were answered. The patient agreed with the plan and demonstrated an understanding of the instructions.   The patient was advised to call back or seek an in-person evaluation if the symptoms worsen or if the condition fails to improve as anticipated.  Jodelle Green, FNP

## 2018-12-16 ENCOUNTER — Encounter: Payer: Self-pay | Admitting: Urology

## 2018-12-16 ENCOUNTER — Other Ambulatory Visit: Payer: Self-pay

## 2018-12-16 ENCOUNTER — Ambulatory Visit (INDEPENDENT_AMBULATORY_CARE_PROVIDER_SITE_OTHER): Payer: 59 | Admitting: Urology

## 2018-12-16 VITALS — BP 129/83 | HR 89 | Ht 73.0 in | Wt 151.3 lb

## 2018-12-16 DIAGNOSIS — N201 Calculus of ureter: Secondary | ICD-10-CM

## 2018-12-16 DIAGNOSIS — N200 Calculus of kidney: Secondary | ICD-10-CM | POA: Diagnosis not present

## 2018-12-16 NOTE — Patient Instructions (Signed)

## 2018-12-16 NOTE — Progress Notes (Signed)
12/16/2018 3:23 PM   Jeffery Cruz Dec 08, 1990 [id number]  Reason for visit: Right distal ureteral stone  HPI: Jeffery Cruz is a healthy 28 year old male here for an acute stone episode.  He was seen in the emergency department at [facility] on 12/10/2018 with right-sided flank and groin pain, and CT showed a 4 mm right distal ureteral stone with some upstream hydronephrosis.  Stone was not clearly seen on scout CT.  Urinalysis showed no signs of infection and he was discharged home on Flomax with medical expulsive therapy.  His pain has been very well controlled over the last few days and is only occasionally noticeable.  He has not seen a stone pass yet.  There is no further imaging to review.  He denies any fevers or chills.  ROS: Please see flowsheet from today's date for complete review of systems.  Physical Exam: BP 129/83 (BP Location: Left Arm, Patient Position: Sitting, Cuff Size: Normal)   Pulse 89   Ht 6\' 1"  (1.854 m)   Wt 151 lb 4.8 oz (68.6 kg)   BMI 19.96 kg/m    Constitutional:  Alert and oriented, No acute distress. Respiratory: Normal respiratory effort, no increased work of breathing. GI: Abdomen is soft, nontender, nondistended, no abdominal masses GU: No CVA tenderness Skin: No rashes, bruises or suspicious lesions. Neurologic: Grossly intact, no focal deficits, moving all 4 extremities. Psychiatric: Normal mood and affect  Pertinent Imaging: I reviewed the outside [facility] CT.  There is a 4 mm right distal ureteral stone, 300HU, SSD 9cm.  Not clearly seen on scout CT  Assessment & Plan:   In summary the patient is a 28 year old healthy male with a 4 mm right distal ureteral stone and very well controlled pain at this time.  We discussed various treatment options for urolithiasis including observation with or without medical expulsive therapy, shockwave lithotripsy (SWL), ureteroscopy and laser lithotripsy with stent placement, and percutaneous nephrolithotomy.  We  discussed that management is based on stone size, location, density, patient co-morbidities, and patient preference.   Stones <36mm in size have a >80% spontaneous passage rate. Data surrounding the use of tamsulosin for medical expulsive therapy is controversial, but meta analyses suggests it is most efficacious for distal stones between 5-57mm in size. Possible side effects include dizziness/lightheadedness, and retrograde ejaculation.  SWL has a lower stone free rate in a single procedure, but also a lower complication rate compared to ureteroscopy and avoids a stent and associated stent related symptoms. Possible complications include renal hematoma, steinstrasse, and need for additional treatment.  Ureteroscopy with laser lithotripsy and stent placement has a higher stone free rate than SWL in a single procedure, however increased complication rate including possible infection, ureteral injury, bleeding, and stent related morbidity. Common stent related symptoms include dysuria, urgency/frequency, and flank pain.  After an extensive discussion of the risks and benefits of the above treatment options, the patient would like to proceed with medical expulsive therapy and close follow-up in 2 weeks with KUB to confirm passage.  -RTC 2 weeks for KUB, sooner if worsening stone pain -He may be a good candidate for SWL if he does not pass his stone, will need KUB to see if stone visible  A total of 25 minutes were spent face-to-face with the patient, greater than 50% was spent in patient education, counseling, and coordination of care regarding stone treatment options.  Billey Co, [facility] [address], [place], [place] [address] [phone]  [phone]   

## 2018-12-24 ENCOUNTER — Telehealth: Payer: Self-pay | Admitting: *Deleted

## 2018-12-24 ENCOUNTER — Encounter: Payer: Self-pay | Admitting: Internal Medicine

## 2018-12-24 NOTE — Telephone Encounter (Signed)
Note in my chart or he can come pick up   [place]
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
## 2018-12-24 NOTE — Telephone Encounter (Signed)
Ok to complete

## 2018-12-25 NOTE — Telephone Encounter (Signed)
Unable to leave message for patient to return call back, VM not set up. PEC may give and obtain information.

## 2018-12-25 NOTE — Telephone Encounter (Signed)
Patient returning call and states that he would like to come by this afternoon and pick this letter up. Please advise.

## 2018-12-25 NOTE — Telephone Encounter (Signed)
Letter has been placed up front for pick up.

## 2018-12-26 IMAGING — CT CT ABD-PEL WO/W CM
3 of 8 series · 15 of 46 positions shown, 17 images · IV contrast (APPLIED)
Comparison: None

CLINICAL DATA: GROSS HEMATURIA.

EXAM:
CT ABDOMEN AND PELVIS WITHOUT AND WITH CONTRAST
TECHNIQUE: Multidetector CT imaging of the abdomen and pelvis was performed
following the standard protocol before and following the bolus
administration of intravenous contrast.
CONTRAST:  100 cc Usovue-DAA

[Series 3: axial pre · axial · non-contrast · 0.69mm/px · z∈[-1150,-750]mm · 10 of 98 slices shown, 12 images]
[im 9/98  soft-tissue]
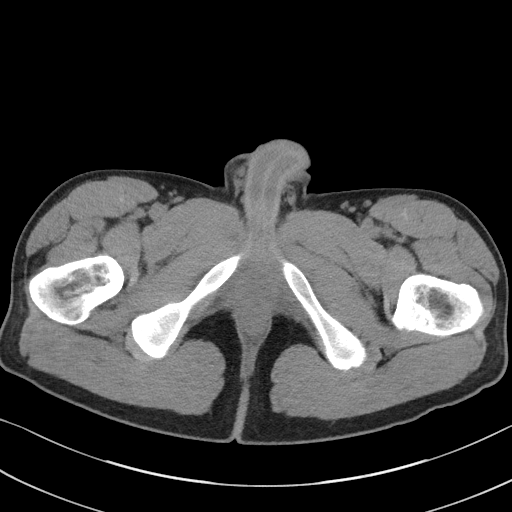
[im 9/98  bone]
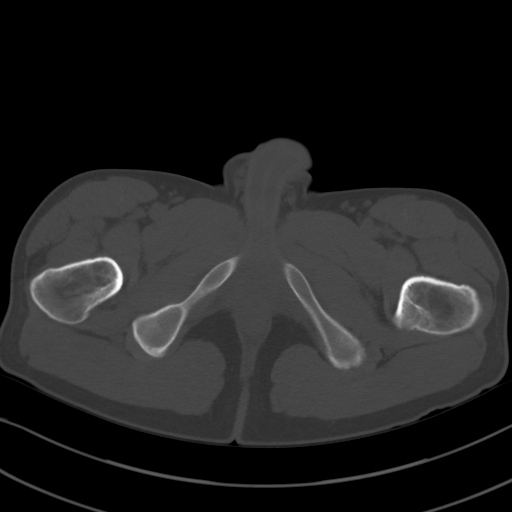
[im 18/98  soft-tissue]
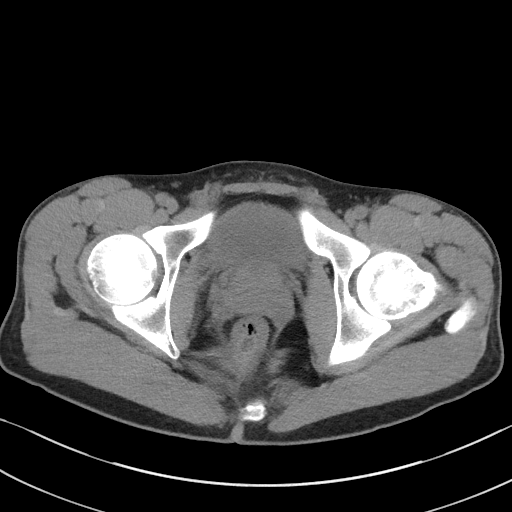
[im 27/98  soft-tissue]
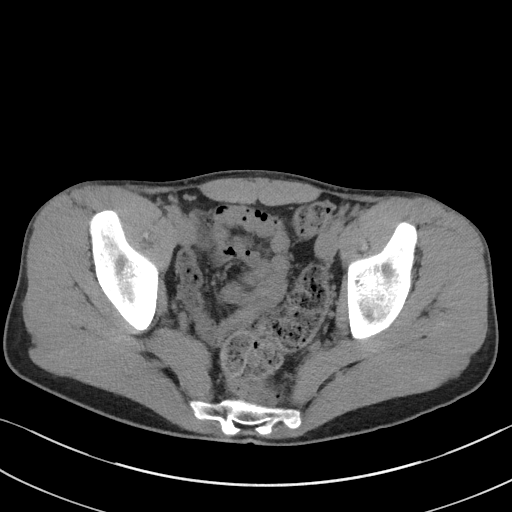
[im 36/98  soft-tissue]
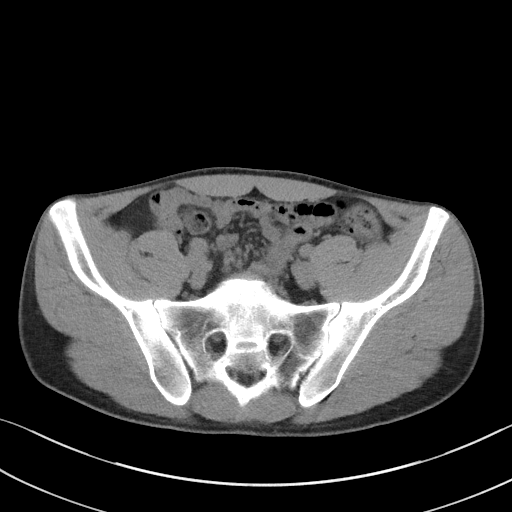
[im 45/98  soft-tissue]
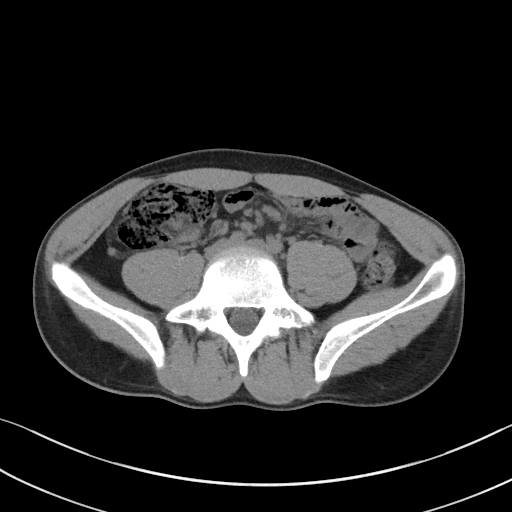
[im 53/98  soft-tissue]
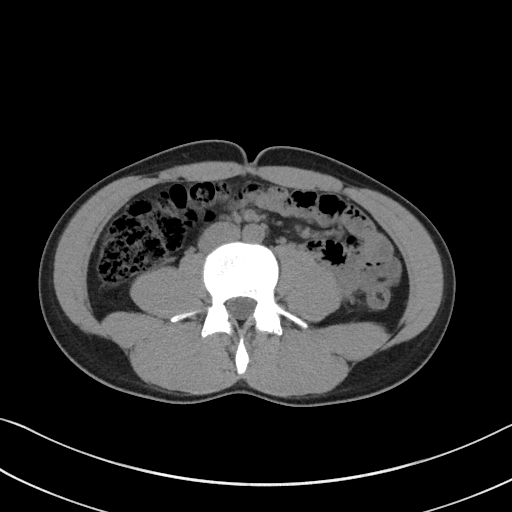
[im 62/98  soft-tissue]
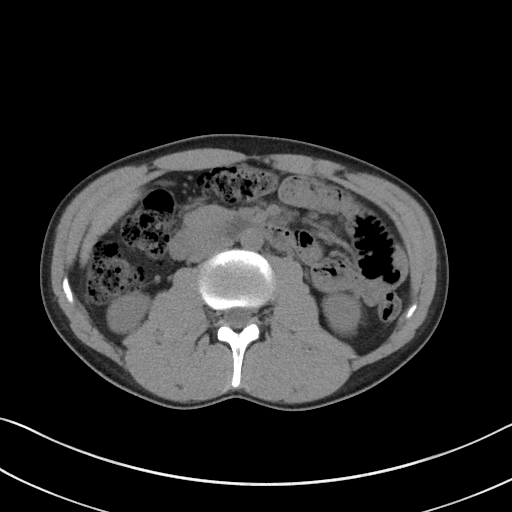
[im 71/98  soft-tissue]
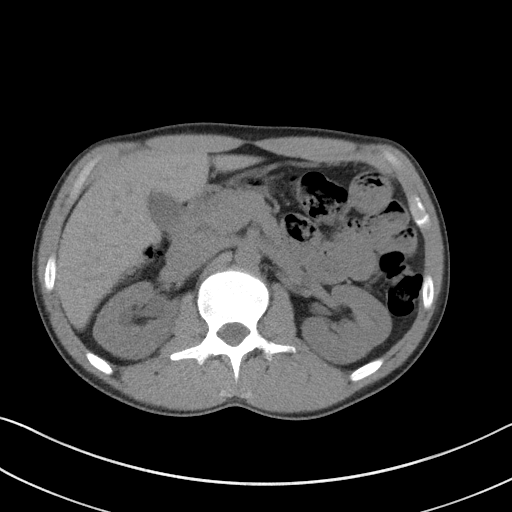
[im 80/98  soft-tissue]
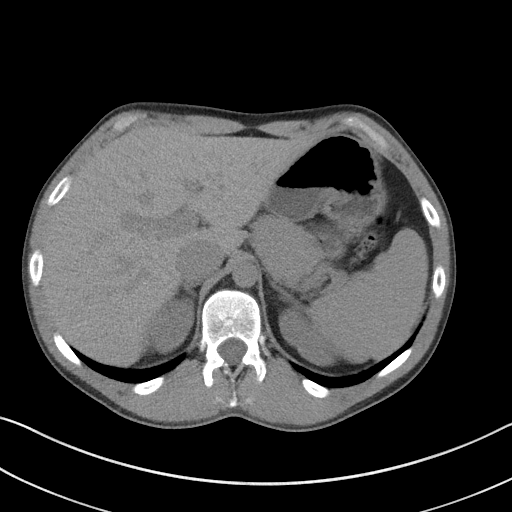
[im 80/98  bone]
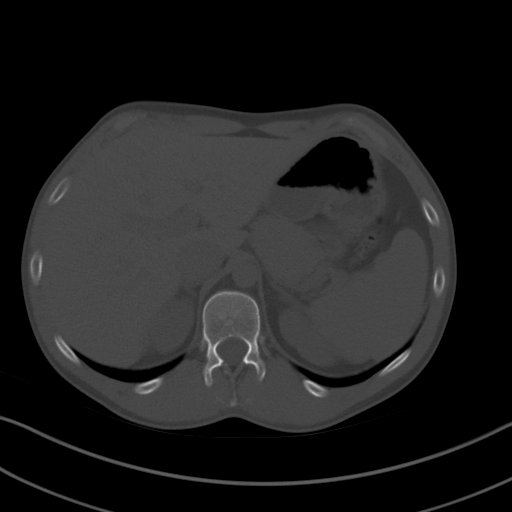
[im 89/98  soft-tissue]
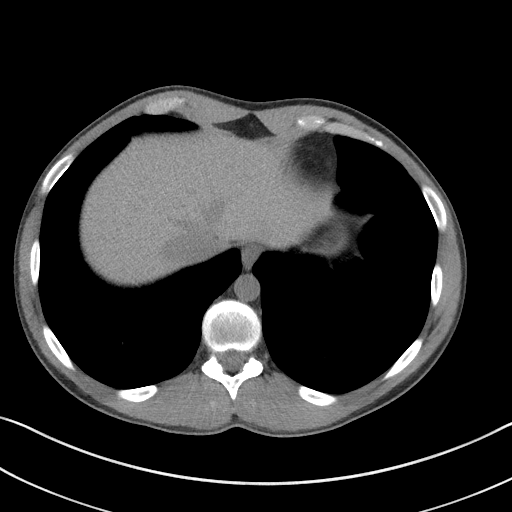

[Series 4: coronal pre · coronal · non-contrast · 0.66mm/px · 3 of 84 slices shown]
[im 21/84  soft-tissue]
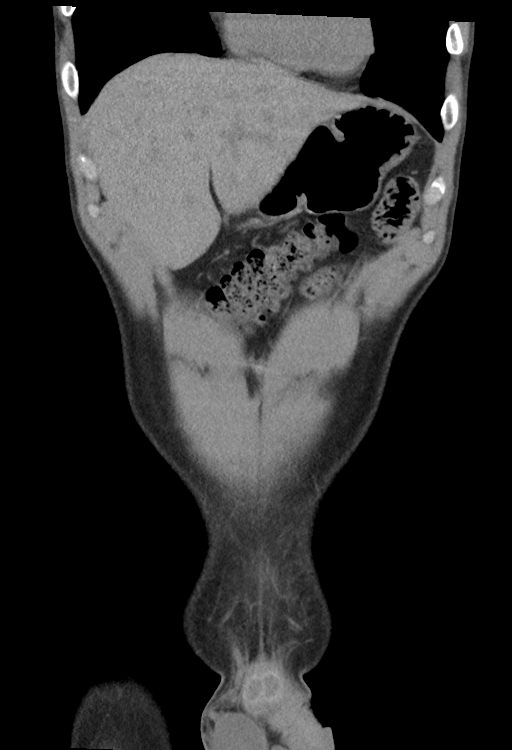
[im 42/84  soft-tissue]
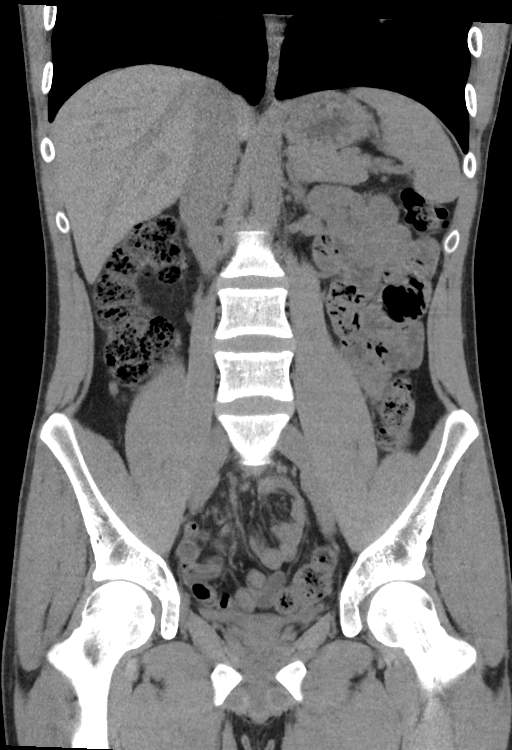
[im 63/84  soft-tissue]
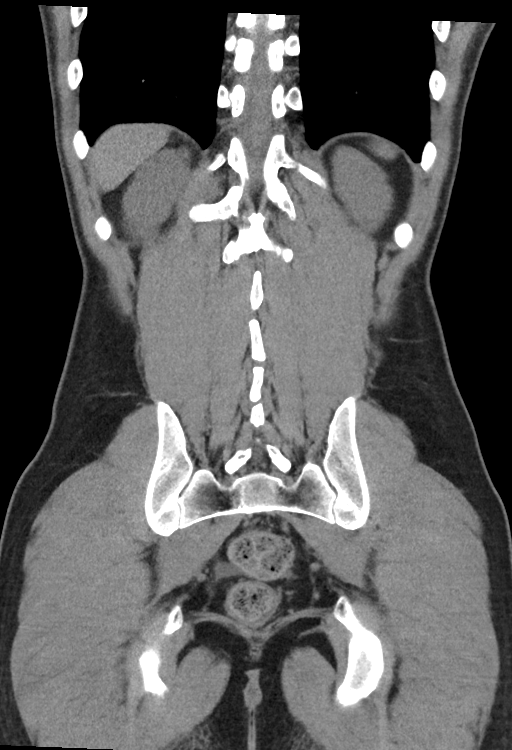

[Series 9: axial delay · axial · delayed · 0.70mm/px · z∈[-1048,-1003]mm · 2 of 99 slices shown]
[im 9/99  soft-tissue]
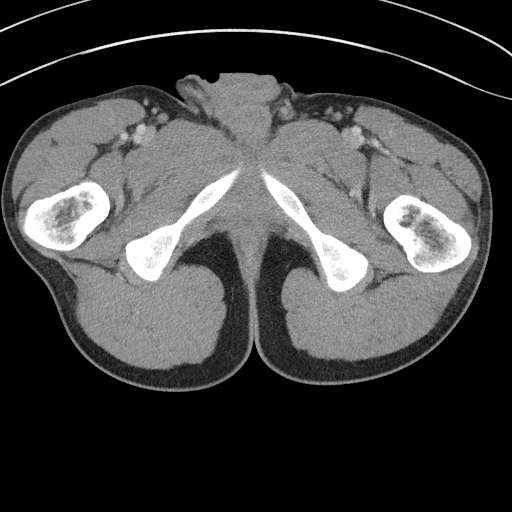
[im 18/99  soft-tissue]
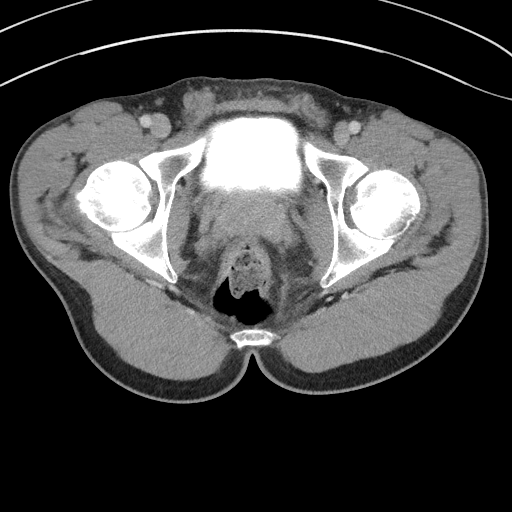

[15 of 46 positions shown; findings below may reference images not displayed]

FINDINGS: Lower chest: No acute abnormality.

Hepatobiliary: No focal liver abnormality is seen. No gallstones,
gallbladder wall thickening, or biliary dilatation.

Pancreas: Unremarkable. No pancreatic ductal dilatation or
surrounding inflammatory changes.

Spleen: Normal in size without focal abnormality.

Adrenals/Urinary Tract: The adrenal glands are normal. No
hydronephrosis identified. Tiny left renal calculus within the
interpolar region measures 2 mm, image 58/4. There are small
bilateral renal hypodensities all measuring less than 1 cm and too
small to reliably characterize. No ureteral calculi or bladder
calculi identified.

Stomach/Bowel: The stomach is normal. The small bowel loops have a
normal course and caliber. No bowel obstruction. The appendix is
visualized and appears normal. Normal appearance of the colon.

Vascular/Lymphatic: Normal appearance of the abdominal aorta. No
enlarged lymph nodes identified.

Reproductive: Prostate is unremarkable.

Other: No abdominal wall hernia or abnormality. No abdominopelvic
ascites.

Musculoskeletal: No acute or significant osseous findings.
IMPRESSION: 1. Punctate left renal calculus is identified. No hydronephrosis or
ureteral lithiasis.
2. Small bilateral renal hypodensities are too small to reliably
characterize measuring less than 1 cm.

## 2018-12-31 ENCOUNTER — Ambulatory Visit: Payer: 59 | Admitting: Urology

## 2019-01-14 ENCOUNTER — Ambulatory Visit: Payer: BLUE CROSS/BLUE SHIELD | Admitting: Urology

## 2019-01-29 ENCOUNTER — Other Ambulatory Visit: Payer: Self-pay

## 2019-01-29 ENCOUNTER — Emergency Department
Admission: EM | Admit: 2019-01-29 | Discharge: 2019-01-29 | Disposition: A | Discharge status: Home / Self Care | Payer: 59 | Attending: Emergency Medicine | Admitting: Emergency Medicine

## 2019-01-29 ENCOUNTER — Encounter: Payer: Self-pay | Admitting: Emergency Medicine

## 2019-01-29 DIAGNOSIS — R109 Unspecified abdominal pain: Secondary | ICD-10-CM

## 2019-01-29 DIAGNOSIS — J45909 Unspecified asthma, uncomplicated: Secondary | ICD-10-CM | POA: Insufficient documentation

## 2019-01-29 DIAGNOSIS — R300 Dysuria: Secondary | ICD-10-CM | POA: Diagnosis present

## 2019-01-29 DIAGNOSIS — R1031 Right lower quadrant pain: Secondary | ICD-10-CM | POA: Insufficient documentation

## 2019-01-29 LAB — BASIC METABOLIC PANEL
Anion gap: 13 (ref 5–15)
BUN: 9 mg/dL (ref 6–20)
CO2: 22 mmol/L (ref 22–32)
Calcium: 8.9 mg/dL (ref 8.9–10.3)
Chloride: 101 mmol/L (ref 98–111)
Creatinine, Ser: 1 mg/dL (ref 0.61–1.24)
GFR calc Af Amer: >60 mL/min (ref >60)
GFR calc non Af Amer: >60 mL/min (ref >60)
Glucose, Bld: 96 mg/dL (ref 70–99)
Potassium: 3.6 mmol/L (ref 3.5–5.1)
Sodium: 136 mmol/L (ref 135–145)

## 2019-01-29 LAB — CBC WITH DIFFERENTIAL/PLATELET
Abs Immature Granulocytes: 0.03 10*3/uL (ref 0.00–0.07)
Basophils Absolute: 0 10*3/uL (ref 0.0–0.1)
Basophils Relative: 0 %
Eosinophils Absolute: 0 10*3/uL (ref 0.0–0.5)
Eosinophils Relative: 0 %
HCT: 36.9 % — ABNORMAL LOW (ref 39.0–52.0)
Hemoglobin: 13.3 g/dL (ref 13.0–17.0)
Immature Granulocytes: 0 %
Lymphocytes Relative: 10 %
Lymphs Abs: 0.8 10*3/uL (ref 0.7–4.0)
MCH: 30.7 pg (ref 26.0–34.0)
MCHC: 36 g/dL (ref 30.0–36.0)
MCV: 85.2 fL (ref 80.0–100.0)
Monocytes Absolute: 1.2 10*3/uL — ABNORMAL HIGH (ref 0.1–1.0)
Monocytes Relative: 14 %
Neutro Abs: 6.5 10*3/uL (ref 1.7–7.7)
Neutrophils Relative %: 76 %
Platelets: 242 10*3/uL (ref 150–400)
RBC: 4.33 MIL/uL (ref 4.22–5.81)
RDW: 12.7 % (ref 11.5–15.5)
WBC: 8.6 10*3/uL (ref 4.0–10.5)
nRBC: 0 % (ref 0.0–0.2)

## 2019-01-29 LAB — URINALYSIS, COMPLETE (UACMP) WITH MICROSCOPIC
Bacteria, UA: NONE SEEN
Bilirubin Urine: NEGATIVE
Glucose, UA: NEGATIVE mg/dL
Hgb urine dipstick: NEGATIVE
Ketones, ur: NEGATIVE mg/dL
Leukocytes,Ua: NEGATIVE
Nitrite: NEGATIVE
Protein, ur: 100 mg/dL — AB
Specific Gravity, Urine: 1.016 (ref 1.005–1.030)
pH: 6 (ref 5.0–8.0)

## 2019-01-29 MED ORDER — SODIUM CHLORIDE 0.9 % IV BOLUS
1000.0000 mL | Freq: Once | INTRAVENOUS | Status: AC
  Administered 2019-01-29: 1000 mL via INTRAVENOUS

## 2019-01-29 MED ORDER — KETOROLAC TROMETHAMINE 10 MG PO TABS: 10.0000 mg | ORAL_TABLET | Freq: Three times a day (TID) | ORAL | 0 refills | Status: DC

## 2019-01-29 MED ORDER — KETOROLAC TROMETHAMINE 30 MG/ML IJ SOLN
30.0000 mg | Freq: Once | INTRAMUSCULAR | Status: AC
  Administered 2019-01-29: 30 mg via INTRAVENOUS
  Filled 2019-01-29: qty 1

## 2019-01-29 NOTE — ED Triage Notes (Signed)
Pt reports for past 3 days has had symptoms of a kidney infection. Pt c/o back pain, urinary frequency and dysuria.

## 2019-01-29 NOTE — ED Notes (Signed)
Levada Dy in lab confirms that gc/chlamydia already added on.

## 2019-01-29 NOTE — ED Provider Notes (Addendum)
[facility] Emergency Department Provider Note ____________________________________________  Time seen: 1643  I have reviewed the triage vital signs and the nursing notes.  HISTORY  Chief Complaint  Urinary Frequency, Dysuria, and Back Pain   HPI Jeffery Cruz is a 28 y.o. male presents himself to the ED for evaluation of 3-day complaint of  flank pain, urinary frequency, and dysuria.  Patient with a history of nephrolithiasis, denies any hematuria, retention, or recent stone passage.  Patient was seen about 4 weeks ago at [facility] for flank pain and was confirmed to have a 4 mm stone in the left ureter.  He was subsequently followed up at [facility], and was treated conservatively with Flomax and expectant stone expulsion.  Patient confirms passage of the kidney stone, and did not seek further management with [place].  He presents today with flank pain and concern for possible kidney infection.  He also admits that he has been poorly compliant, noting almost exclusive intake of carbonated drinks.  Patient is reporting that he feels dehydrated, and is requesting IV fluid hydration.  Past Medical History:  Diagnosis Date  . Allergy   . Asthma   . Bee sting reaction   . Kidney stone     Patient Active Problem List   Diagnosis Date Noted  . Hypophosphatemia 11/26/2018  . Kidney cysts 11/26/2018  . Fatigue 11/26/2018  . Closed fracture of temporal bone ([place]) 10/29/2018  . Concussion with loss of consciousness 10/29/2018  . Focal hemorrhagic contusion of cerebrum ([place]) 10/29/2018  . Multiple nevi 06/14/2018  . Bee sting reaction 08/28/2017  . Kidney stone 04/12/2017  . Bilateral thoracic back pain 01/11/2015  . Left shoulder pain 09/09/2014  . Encounter to establish care 08/11/2014  . Insomnia 08/11/2014  . H/O extrinsic asthma 08/11/2014  . Environmental allergies 08/11/2014    Past Surgical History:  Procedure Laterality Date  .  WISDOM TOOTH EXTRACTION Bilateral 2013     Prior to Admission medications   Medication Sig Start Date End Date Taking? Authorizing Provider  cyclobenzaprine (FLEXERIL) 5 MG tablet Take 1 tablet (5 mg total) by mouth at bedtime as needed for muscle spasms. 11/26/18   McLean-Scocuzza, Nino Glow, MD  EPINEPHrine (EPIPEN 2-PAK) 0.3 mg/0.3 mL IJ SOAJ injection Inject 0.3 mLs (0.3 mg total) into the muscle as needed for anaphylaxis. 05/24/18   McLean-Scocuzza, Nino Glow, MD  ketorolac (TORADOL) 10 MG tablet Take 1 tablet (10 mg total) by mouth every 8 (eight) hours. 01/29/19   Aanya Haynes, Dannielle Karvonen, PA-C    Allergies Bee venom  Family History  Problem Relation Age of Onset  . Allergies Father   . Allergies Sister   . Arthritis Maternal Grandmother   . Arthritis Paternal Grandmother     Social History Social History   Tobacco Use  . Smoking status: Never Smoker  . Smokeless tobacco: Never Used  Substance Use Topics  . Alcohol use: Yes    Alcohol/week: 0.0 standard drinks  . Drug use: No    Review of Systems  Constitutional: Negative for fever. Eyes: Negative for visual changes. ENT: Negative for sore throat. Cardiovascular: Negative for chest pain. Respiratory: Negative for shortness of breath. Gastrointestinal: Negative for abdominal pain, vomiting and diarrhea. Genitourinary: Positive for dysuria and flank pain.  Urinary retention, hematuria, or penile discharge. Musculoskeletal: Negative for back pain. Skin: Negative for rash. Neurological: Negative for headaches, focal weakness or numbness. ____________________________________________  PHYSICAL EXAM:  VITAL SIGNS: ED Triage Vitals  Enc  Vitals Group     BP 01/29/19 1609 106/67     Pulse Rate 01/29/19 1609 72     Resp 01/29/19 1609 16     Temp 01/29/19 1609 99.7 F (37.6 C)     Temp Source 01/29/19 1609 Oral     SpO2 01/29/19 1609 98 %     Weight 01/29/19 1531 150 lb (68 kg)     Height 01/29/19 1531 6\' 1"  (1.854 m)      Head Circumference --      Peak Flow --      Pain Score 01/29/19 1531 0     Pain Loc --      Pain Edu? --      Excl. in [place]? --     Constitutional: Alert and oriented. Well appearing and in no distress. Head: Normocephalic and atraumatic. Eyes: Conjunctivae are normal. Normal extraocular movements Cardiovascular: Normal rate, regular rhythm. Normal distal pulses. Respiratory: Normal respiratory effort. No wheezes/rales/rhonchi. Gastrointestinal: Soft and nontender. No distention, rebound, guarding, or rigidity.  No CVA tenderness elicited. Musculoskeletal: Nontender with normal range of motion in all extremities.  Neurologic:  Normal gait without ataxia. Normal speech and language. No gross focal neurologic deficits are appreciated. Skin:  Skin is warm, dry and intact. No rash noted. Psychiatric: Mood and affect are normal. Patient exhibits appropriate insight and judgment. ____________________________________________   LABS (pertinent positives/negatives) Labs Reviewed  URINALYSIS, COMPLETE (UACMP) WITH MICROSCOPIC - Abnormal; Notable for the following components:      Result Value   Color, Urine YELLOW (*)    APPearance CLEAR (*)    Protein, ur 100 (*)    All other components within normal limits  CBC WITH DIFFERENTIAL/PLATELET - Abnormal; Notable for the following components:   HCT 36.9 (*)    Monocytes Absolute 1.2 (*)    All other components within normal limits  GC/CHLAMYDIA PROBE AMP  BASIC METABOLIC PANEL  ____________________________________________  PROCEDURES  NS 1000 ml bolus Toradol 30 mg IVP Procedures ____________________________________________  INITIAL IMPRESSION / ASSESSMENT AND PLAN / ED COURSE  DDX: urethritis, hematuria, urinary retention  Patient presents to the ED with complaints of dysuria and flank pain.  Exam is overall benign reassuring at this time.  Labs do not reveal any acute infectious process, leukocytosis, leukocyturia.  Patient  reports improvement of his symptoms after IV fluid hydration and IV Toradol.  He is discharged at this time to follow-up with urology for ongoing symptoms.  He will be given a prescription for Toradol to take as needed.  Return precautions have been reviewed.  Jeffery Cruz was evaluated in Emergency Department on 01/29/2019 for the symptoms described in the history of present illness. He was evaluated in the context of the global COVID-19 pandemic, which necessitated consideration that the patient might be at risk for infection with the SARS-CoV-2 virus that causes COVID-19. Institutional protocols and algorithms that pertain to the evaluation of patients at risk for COVID-19 are in a state of rapid change based on information released by regulatory bodies including the CDC and federal and state organizations. These policies and algorithms were followed during the patient's care in the ED. ____________________________________________  FINAL CLINICAL IMPRESSION(S) / ED DIAGNOSES  Final diagnoses:  Dysuria  Acute right flank pain      Carmie End, Dannielle Karvonen, PA-C 01/29/19 2053    Melvenia Needles, PA-C 01/29/19 2054    Blake Divine, MD 01/31/19 [phone]

## 2019-01-29 NOTE — Discharge Instructions (Addendum)
Your exam and labs are normal at this time. You have a lab test pending, and will be notified if treatment is required. Be sure to increase intake of non-carbonated drinks. Follow-up with Urology for ongoing symptoms. Return to the ED as needed.

## 2019-01-29 NOTE — ED Notes (Signed)
Pt has urine cup specimen but reports he can not urinate at this time.

## 2019-02-02 LAB — GC/CHLAMYDIA PROBE AMP
Chlamydia trachomatis, NAA: NEGATIVE
Neisseria Gonorrhoeae by PCR: NEGATIVE

## 2019-02-03 ENCOUNTER — Encounter: Payer: Self-pay | Admitting: Emergency Medicine

## 2019-02-03 ENCOUNTER — Ambulatory Visit
Admission: EM | Admit: 2019-02-03 | Discharge: 2019-02-03 | Disposition: A | Discharge status: Home / Self Care | Payer: 59 | Attending: Emergency Medicine | Admitting: Emergency Medicine

## 2019-02-03 ENCOUNTER — Other Ambulatory Visit: Payer: Self-pay

## 2019-02-03 DIAGNOSIS — Z202 Contact with and (suspected) exposure to infections with a predominantly sexual mode of transmission: Secondary | ICD-10-CM | POA: Diagnosis not present

## 2019-02-03 DIAGNOSIS — N5089 Other specified disorders of the male genital organs: Secondary | ICD-10-CM

## 2019-02-03 MED ORDER — VALACYCLOVIR HCL 1 G PO TABS: 1000.0000 mg | ORAL_TABLET | Freq: Two times a day (BID) | ORAL | 0 refills | Status: AC

## 2019-02-03 NOTE — ED Provider Notes (Signed)
Jeffery Cruz    CSN: [id number] Arrival date & time: 02/03/19  1204      History   Chief Complaint Chief Complaint  Patient presents with  . STI check and sores on penis    HPI Jeffery Cruz is a 28 y.o. male.   Patient presents with sores in his genital area and on his tongue x2 to 3 days.  He reports burning with urination also.  He was seen in the ED on 01/29/2019; he was negative for GC and chlamydia; he did not have sores at that time.  He is sexually active and does not use condoms.  He denies fever, chills, abdominal pain, back pain, penile discharge, testicular pain, or other symptoms.  No treatments attempted at home.    The history is provided by the patient.    Past Medical History:  Diagnosis Date  . Allergy   . Asthma   . Bee sting reaction   . Kidney stone     Patient Active Problem List   Diagnosis Date Noted  . Hypophosphatemia 11/26/2018  . Kidney cysts 11/26/2018  . Fatigue 11/26/2018  . Closed fracture of temporal bone ([place]) 10/29/2018  . Concussion with loss of consciousness 10/29/2018  . Focal hemorrhagic contusion of cerebrum ([place]) 10/29/2018  . Multiple nevi 06/14/2018  . Bee sting reaction 08/28/2017  . Kidney stone 04/12/2017  . Bilateral thoracic back pain 01/11/2015  . Left shoulder pain 09/09/2014  . Encounter to establish care 08/11/2014  . Insomnia 08/11/2014  . H/O extrinsic asthma 08/11/2014  . Environmental allergies 08/11/2014    Past Surgical History:  Procedure Laterality Date  . WISDOM TOOTH EXTRACTION Bilateral 2013        Home Medications    Prior to Admission medications   Medication Sig Start Date End Date Taking? Authorizing Provider  cyclobenzaprine (FLEXERIL) 5 MG tablet Take 1 tablet (5 mg total) by mouth at bedtime as needed for muscle spasms. 11/26/18   McLean-Scocuzza, Nino Glow, MD  EPINEPHrine (EPIPEN 2-PAK) 0.3 mg/0.3 mL IJ SOAJ injection Inject 0.3 mLs (0.3 mg total) into the muscle as needed  for anaphylaxis. 05/24/18   McLean-Scocuzza, Nino Glow, MD  ketorolac (TORADOL) 10 MG tablet Take 1 tablet (10 mg total) by mouth every 8 (eight) hours. 01/29/19   Menshew, Dannielle Karvonen, PA-C  valACYclovir (VALTREX) 1000 MG tablet Take 1 tablet (1,000 mg total) by mouth 2 (two) times daily for 10 days. 02/03/19 02/13/19  Sharion Balloon, NP    Family History Family History  Problem Relation Age of Onset  . Allergies Father   . Allergies Sister   . Arthritis Maternal Grandmother   . Arthritis Paternal Grandmother     Social History Social History   Tobacco Use  . Smoking status: Never Smoker  . Smokeless tobacco: Never Used  Substance Use Topics  . Alcohol use: Yes    Alcohol/week: 0.0 standard drinks  . Drug use: No     Allergies   Bee venom   Review of Systems Review of Systems  Constitutional: Negative for chills and fever.  HENT: Negative for ear pain and sore throat.   Eyes: Negative for pain and visual disturbance.  Respiratory: Negative for cough and shortness of breath.   Cardiovascular: Negative for chest pain and palpitations.  Gastrointestinal: Negative for abdominal pain and vomiting.  Genitourinary: Positive for dysuria and genital sores. Negative for discharge, flank pain, hematuria and testicular pain.  Musculoskeletal: Negative for arthralgias and back  pain.  Skin: Negative for color change and rash.  Neurological: Negative for seizures and syncope.  All other systems reviewed and are negative.    Physical Exam Triage Vital Signs ED Triage Vitals [02/03/19 1207]  Enc Vitals Group     BP 112/80     Pulse Rate 93     Resp 16     Temp 99.2 F (37.3 C)     Temp Source Oral     SpO2 96 %     Weight      Height      Head Circumference      Peak Flow      Pain Score      Pain Loc      Pain Edu?      Excl. in [place]?    No data found.  Updated Vital Signs BP 112/80 (BP Location: Left Arm)   Pulse 93   Temp 99.2 F (37.3 C) (Oral)   Resp 16   Wt  150 lb (68 kg)   SpO2 96%   BMI 19.79 kg/m   Visual Acuity Right Eye Distance:   Left Eye Distance:   Bilateral Distance:    Right Eye Near:   Left Eye Near:    Bilateral Near:     Physical Exam Vitals and nursing note reviewed.  Constitutional:      Appearance: He is well-developed.  HENT:     Head: Normocephalic and atraumatic.     Mouth/Throat:     Mouth: Mucous membranes are moist.     Comments: Open sore on tongue. Eyes:     Conjunctiva/sclera: Conjunctivae normal.  Cardiovascular:     Rate and Rhythm: Normal rate and regular rhythm.     Heart sounds: No murmur.  Pulmonary:     Effort: Pulmonary effort is normal. No respiratory distress.     Breath sounds: Normal breath sounds.  Abdominal:     General: Bowel sounds are normal.     Palpations: Abdomen is soft.     Tenderness: There is no abdominal tenderness. There is no right CVA tenderness, left CVA tenderness, guarding or rebound.  Genitourinary:    Testes: Normal.     Comments: Several open sores in genital area; no drainage; various stages of healing.   Musculoskeletal:     Cervical back: Neck supple.  Skin:    General: Skin is warm and dry.  Neurological:     Mental Status: He is alert.      UC Treatments / Results  Labs (all labs ordered are listed, but only abnormal results are displayed) Labs Reviewed - No data to display  EKG   Radiology No results found.  Procedures Procedures (including critical care time)  Medications Ordered in UC Medications - No data to display  Initial Impression / Assessment and Plan / UC Course  I have reviewed the triage vital signs and the nursing notes.  Pertinent labs & imaging results that were available during my care of the patient were reviewed by me and considered in my medical decision making (see chart for details).    Genital lesions.  Potential exposure to STD.  Penile swab for STDs obtained.  Swab of genital sores obtained for Herpes tesing.   Treating with valacyclovir.  Instructed patient not to have sexual contact until the test results are back and all sores are healed.  Discussed with patient that we will call him if his STD test are positive and that he and his  partner may require additional treatment at that time.  Patient agrees to this plan of care.     Final Clinical Impressions(s) / UC Diagnoses   Final diagnoses:  Potential exposure to STD  Genital lesion, male     Discharge Instructions     Take the valacyclovir as directed.  Do not have sex until your test results are back and all sores are healed.  Your STD tests are pending.  If your test results are positive, we will call you.  You may need additional treatment and your partner may also need treatment.           ED Prescriptions    Medication Sig Dispense Auth. Provider   valACYclovir (VALTREX) 1000 MG tablet Take 1 tablet (1,000 mg total) by mouth 2 (two) times daily for 10 days. 20 tablet Sharion Balloon, NP     PDMP not reviewed this encounter.   Sharion Balloon, NP 02/03/19 1253

## 2019-02-03 NOTE — Discharge Instructions (Addendum)
Take the valacyclovir as directed.  Do not have sex until your test results are back and all sores are healed.  Your STD tests are pending.  If your test results are positive, we will call you.  You may need additional treatment and your partner may also need treatment.

## 2019-02-03 NOTE — ED Triage Notes (Signed)
Patient in office today c/o STI check and sores of penis x 2d ago hurts to urinate

## 2019-02-04 LAB — CYTOLOGY, (ORAL, ANAL, URETHRAL) ANCILLARY ONLY
Chlamydia: NEGATIVE
Neisseria Gonorrhea: NEGATIVE
Trichomonas: NEGATIVE

## 2019-02-05 LAB — HSV CULTURE AND TYPING

## 2019-02-06 ENCOUNTER — Telehealth (HOSPITAL_COMMUNITY): Payer: Self-pay | Admitting: Emergency Medicine

## 2019-02-06 NOTE — Telephone Encounter (Signed)
Hsv culture positive for type 1. Pt was given valtrex at visit. Patient contacted by phone and made aware of    results. Pt verbalized understanding and had all questions answered.

## 2019-11-12 IMAGING — US US RENAL
1 series · 14 of 25 positions shown · non-contrast
Comparison: None.

CLINICAL DATA: Left flank pain, hematuria

EXAM:
RENAL / URINARY TRACT ULTRASOUND COMPLETE

[Series 1: us renal · 0.23mm/px · 14 of 40 slices shown]
[im 1/40]
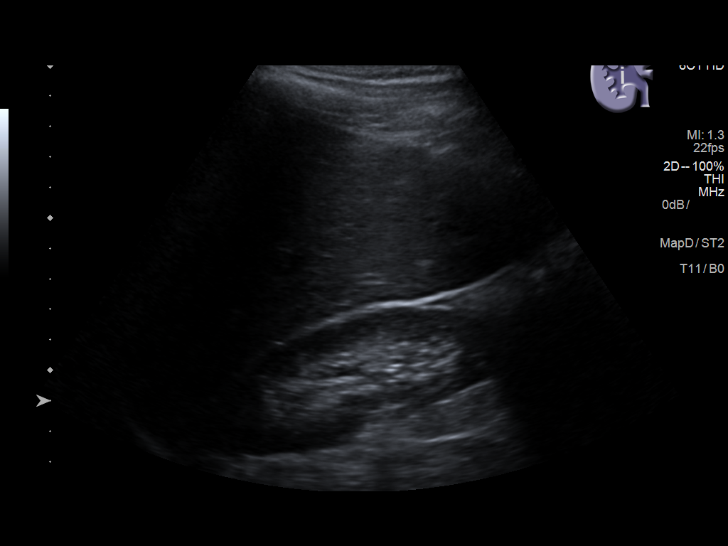
[im 4/40]
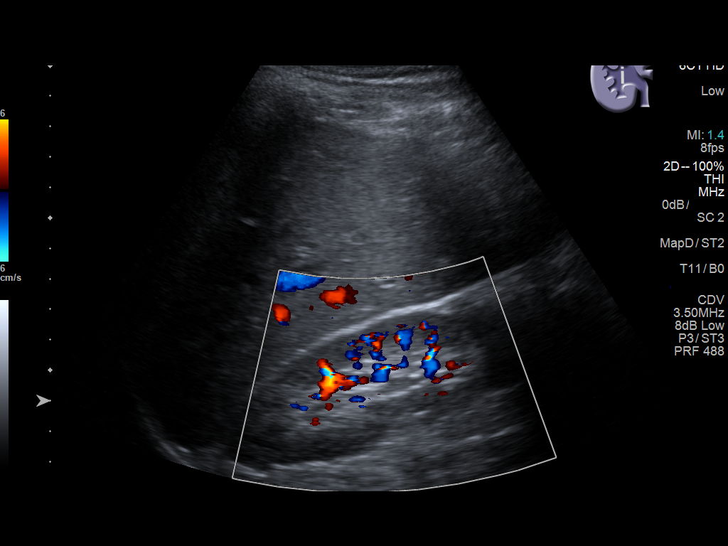
[im 7/40]
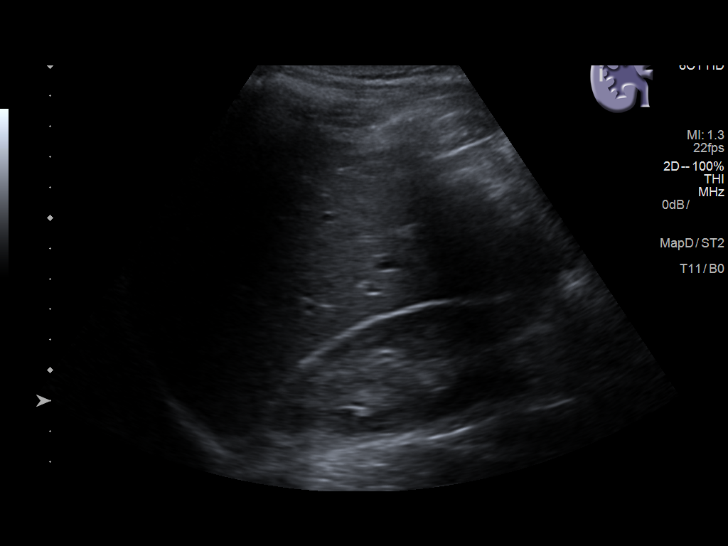
[im 10/40]
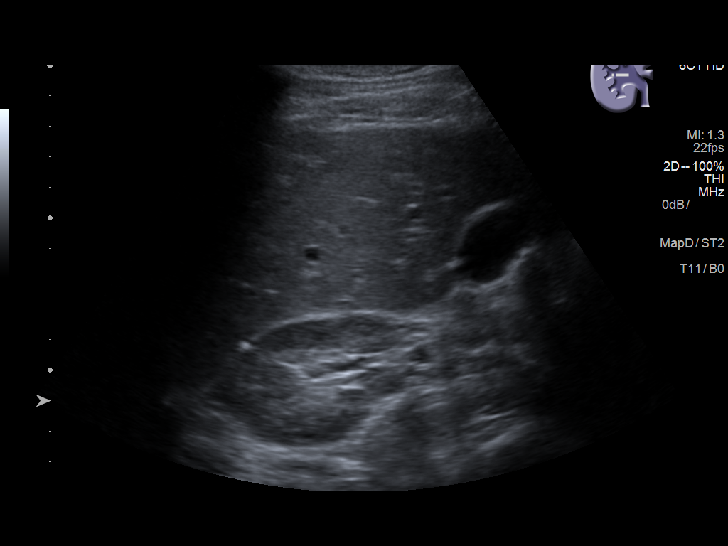
[im 14/40]
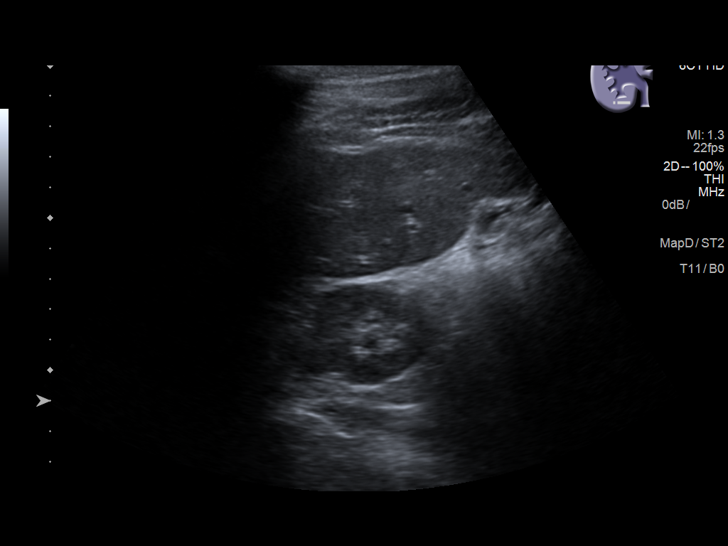
[im 15/40]
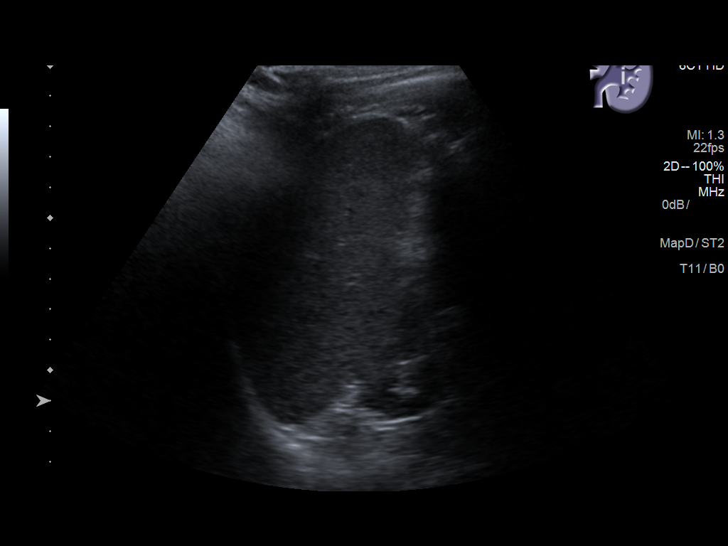
[im 18/40]
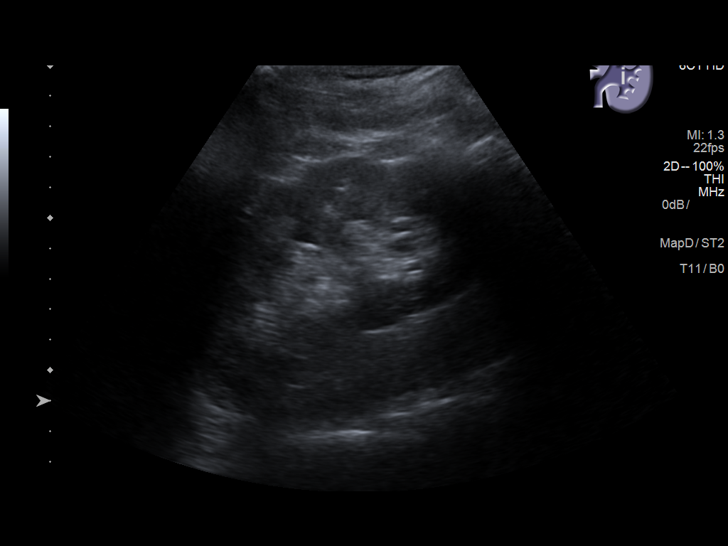
[im 22/40]
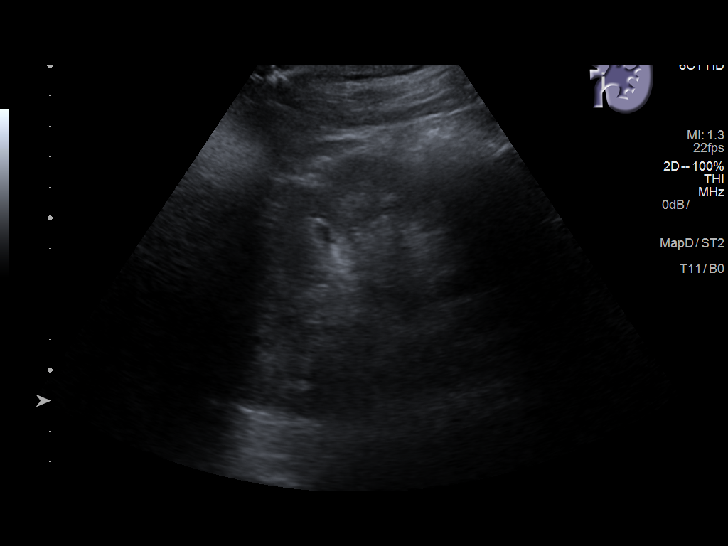
[im 25/40]
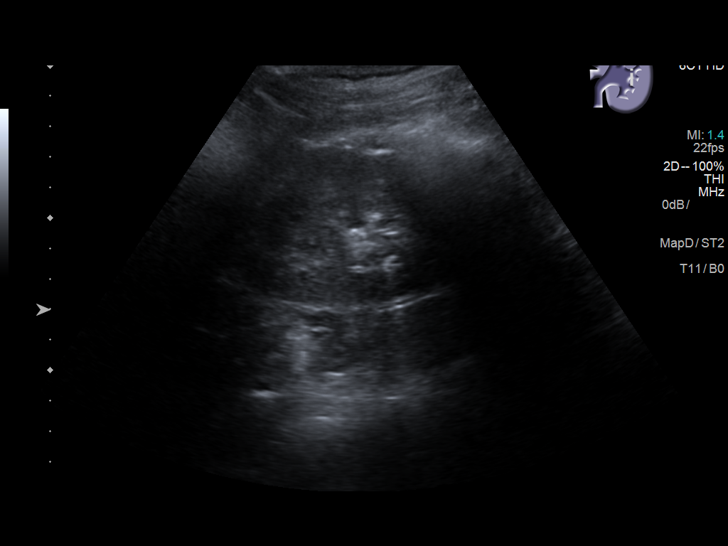
[im 27/40]
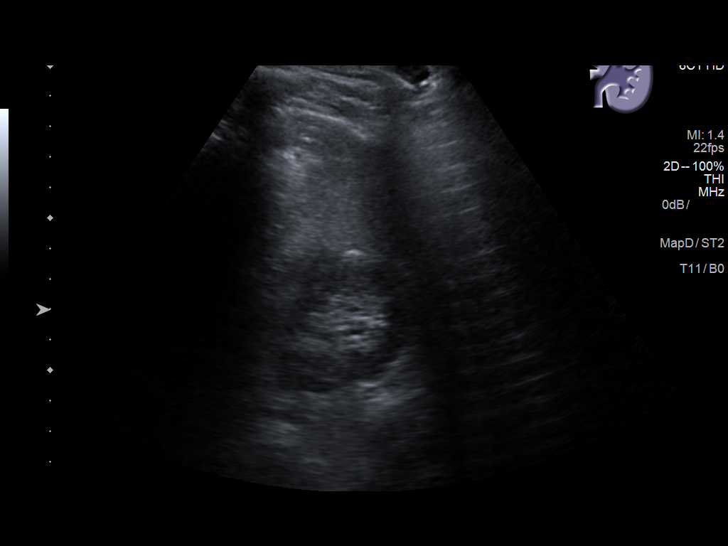
[im 30/40]
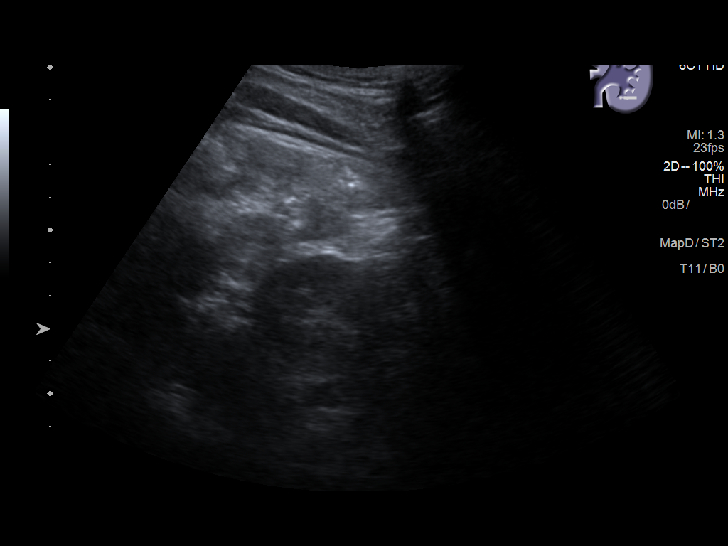
[im 33/40]
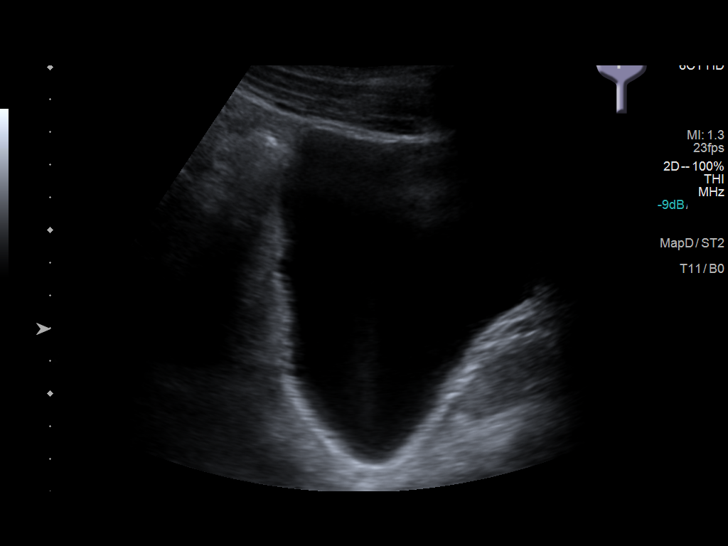
[im 36/40]
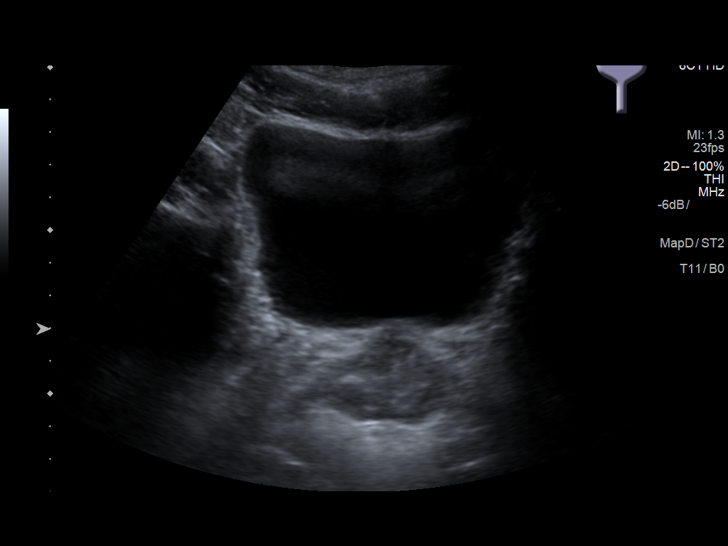
[im 40/40]
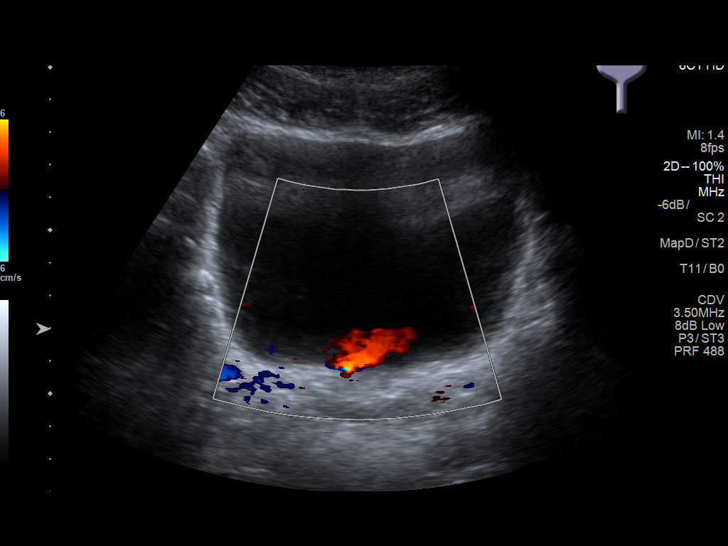

[14 of 25 positions shown; findings below may reference images not displayed]

FINDINGS: Right Kidney:

Length: 10.4 cm. Echogenicity within normal limits. No mass or
hydronephrosis visualized.

Left Kidney:

Length: 11 cm. Echogenicity within normal limits. No mass or
hydronephrosis visualized.

Bladder:

Appears normal for degree of bladder distention.
IMPRESSION: Normal renal ultrasound.

## 2019-11-27 ENCOUNTER — Encounter: Payer: 59 | Admitting: Internal Medicine

## 2021-05-26 ENCOUNTER — Telehealth: Payer: Self-pay | Admitting: Internal Medicine

## 2021-05-26 NOTE — Telephone Encounter (Signed)
Pt mom called wanting to know if pt had the Hep B shot and if so does he have to get a booster ?

## 2023-02-26 ENCOUNTER — Emergency Department: Payer: BC Managed Care – PPO

## 2023-02-26 ENCOUNTER — Emergency Department
Admission: EM | Admit: 2023-02-26 | Discharge: 2023-02-26 | Disposition: A | Discharge status: Home / Self Care | Payer: BC Managed Care – PPO | Attending: Emergency Medicine | Admitting: Emergency Medicine

## 2023-02-26 ENCOUNTER — Other Ambulatory Visit: Payer: Self-pay

## 2023-02-26 DIAGNOSIS — J45909 Unspecified asthma, uncomplicated: Secondary | ICD-10-CM | POA: Diagnosis not present

## 2023-02-26 DIAGNOSIS — N132 Hydronephrosis with renal and ureteral calculous obstruction: Secondary | ICD-10-CM | POA: Insufficient documentation

## 2023-02-26 DIAGNOSIS — R109 Unspecified abdominal pain: Secondary | ICD-10-CM

## 2023-02-26 DIAGNOSIS — N200 Calculus of kidney: Secondary | ICD-10-CM

## 2023-02-26 LAB — URINALYSIS, ROUTINE W REFLEX MICROSCOPIC
Bacteria, UA: NONE SEEN
Bilirubin Urine: NEGATIVE
Glucose, UA: NEGATIVE mg/dL
Ketones, ur: NEGATIVE mg/dL
Leukocytes,Ua: NEGATIVE
Nitrite: NEGATIVE
Protein, ur: 30 mg/dL — AB
RBC / HPF: >50 RBC/hpf (ref 0–5)
Specific Gravity, Urine: 1.026 (ref 1.005–1.030)
Squamous Epithelial / HPF: 0 /[HPF] (ref 0–5)
pH: 5 (ref 5.0–8.0)

## 2023-02-26 LAB — CBC WITH DIFFERENTIAL/PLATELET
Abs Immature Granulocytes: 0.04 10*3/uL (ref 0.00–0.07)
Basophils Absolute: 0.1 10*3/uL (ref 0.0–0.1)
Basophils Relative: 1 %
Eosinophils Absolute: 0.5 10*3/uL (ref 0.0–0.5)
Eosinophils Relative: 6 %
HCT: 39.3 % (ref 39.0–52.0)
Hemoglobin: 13.6 g/dL (ref 13.0–17.0)
Immature Granulocytes: 1 %
Lymphocytes Relative: 24 %
Lymphs Abs: 2 10*3/uL (ref 0.7–4.0)
MCH: 30.8 pg (ref 26.0–34.0)
MCHC: 34.6 g/dL (ref 30.0–36.0)
MCV: 88.9 fL (ref 80.0–100.0)
Monocytes Absolute: 0.6 10*3/uL (ref 0.1–1.0)
Monocytes Relative: 8 %
Neutro Abs: 5.1 10*3/uL (ref 1.7–7.7)
Neutrophils Relative %: 60 %
Platelets: 278 10*3/uL (ref 150–400)
RBC: 4.42 MIL/uL (ref 4.22–5.81)
RDW: 12.3 % (ref 11.5–15.5)
WBC: 8.3 10*3/uL (ref 4.0–10.5)
nRBC: 0 % (ref 0.0–0.2)

## 2023-02-26 LAB — BASIC METABOLIC PANEL
Anion gap: 9 (ref 5–15)
BUN: 13 mg/dL (ref 6–20)
CO2: 24 mmol/L (ref 22–32)
Calcium: 8.4 mg/dL — ABNORMAL LOW (ref 8.9–10.3)
Chloride: 103 mmol/L (ref 98–111)
Creatinine, Ser: 1.11 mg/dL (ref 0.61–1.24)
GFR, Estimated: >60 mL/min (ref >60)
Glucose, Bld: 128 mg/dL — ABNORMAL HIGH (ref 70–99)
Potassium: 4 mmol/L (ref 3.5–5.1)
Sodium: 136 mmol/L (ref 135–145)

## 2023-02-26 MED ORDER — ONDANSETRON 4 MG PO TBDP: 4.0000 mg | ORAL_TABLET | Freq: Three times a day (TID) | ORAL | 0 refills | Status: AC | PRN

## 2023-02-26 MED ORDER — ONDANSETRON 4 MG PO TBDP
4.0000 mg | ORAL_TABLET | Freq: Once | ORAL | Status: AC
  Administered 2023-02-26: 4 mg via ORAL
  Filled 2023-02-26: qty 1

## 2023-02-26 MED ORDER — KETOROLAC TROMETHAMINE 30 MG/ML IJ SOLN
15.0000 mg | Freq: Once | INTRAMUSCULAR | Status: AC
  Administered 2023-02-26: 15 mg via INTRAVENOUS
  Filled 2023-02-26: qty 1

## 2023-02-26 MED ORDER — ONDANSETRON HCL 4 MG/2ML IJ SOLN
4.0000 mg | Freq: Once | INTRAMUSCULAR | Status: AC
  Administered 2023-02-26: 4 mg via INTRAVENOUS
  Filled 2023-02-26: qty 2

## 2023-02-26 MED ORDER — OXYCODONE-ACETAMINOPHEN 5-325 MG PO TABS
1.0000 | ORAL_TABLET | Freq: Once | ORAL | Status: AC
  Administered 2023-02-26: 1 via ORAL
  Filled 2023-02-26: qty 1

## 2023-02-26 MED ORDER — OXYCODONE-ACETAMINOPHEN 5-325 MG PO TABS: 1.0000 | ORAL_TABLET | ORAL | 0 refills | Status: AC | PRN

## 2023-02-26 MED ORDER — SODIUM CHLORIDE 0.9 % IV BOLUS
1000.0000 mL | Freq: Once | INTRAVENOUS | Status: AC
  Administered 2023-02-26: 1000 mL via INTRAVENOUS

## 2023-02-26 NOTE — ED Triage Notes (Signed)
Pt reports right side flank pain that began earlier tonight. Pt has hx kidney stones. Denies urinary symptoms.

## 2023-02-26 NOTE — ED Provider Notes (Signed)
[facility] Provider Note    Event Date/Time   First MD Initiated Contact with Patient 02/26/23 [phone]     (approximate)   History   Chief Complaint Flank Pain   HPI  KYRIN THEURER is a 33 y.o. male with past medical history of asthma and kidney stones who presents to the ED complaining of flank pain.  Patient reports that about 2 weeks ago he began to notice pain in his right flank similar to prior kidney stones.  Pain was initially mild, waxing and waning in severity, however became acutely worse earlier this evening.  He reports some blood in his urine, has not had any fevers or dysuria.  He has not take anything for his symptoms prior to arrival.     Physical Exam   Triage Vital Signs: ED Triage Vitals  Encounter Vitals Group     BP 02/26/23 0125 (!) 115/95     Systolic BP Percentile --      Diastolic BP Percentile --      Pulse Rate 02/26/23 0125 87     Resp 02/26/23 0125 18     Temp 02/26/23 0125 98.4 F (36.9 C)     Temp Source 02/26/23 0125 Oral     SpO2 02/26/23 0125 99 %     Weight 02/26/23 0124 160 lb (72.6 kg)     Height 02/26/23 0124 6\' 2"  (1.88 m)     Head Circumference --      Peak Flow --      Pain Score 02/26/23 0124 10     Pain Loc --      Pain Education --      Exclude from Growth Chart --     Most recent vital signs: Vitals:   02/26/23 0125 02/26/23 0447  BP: (!) 115/95 102/72  Pulse: 87 (!) 58  Resp: 18 18  Temp: 98.4 F (36.9 C)   SpO2: 99% 98%    Constitutional: Alert and oriented. Eyes: Conjunctivae are normal. Head: Atraumatic. Nose: No congestion/rhinnorhea. Mouth/Throat: Mucous membranes are moist.  Cardiovascular: Normal rate, regular rhythm. Grossly normal heart sounds.  2+ radial pulses bilaterally. Respiratory: Normal respiratory effort.  No retractions. Lungs CTAB. Gastrointestinal: Soft and nontender. No distention.  No CVA tenderness bilaterally. Musculoskeletal: No lower extremity tenderness  nor edema.  Neurologic:  Normal speech and language. No gross focal neurologic deficits are appreciated.    ED Results / Procedures / Treatments   Labs (all labs ordered are listed, but only abnormal results are displayed) Labs Reviewed  BASIC METABOLIC PANEL - Abnormal; Notable for the following components:      Result Value   Glucose, Bld 128 (*)    Calcium 8.4 (*)    All other components within normal limits  URINALYSIS, ROUTINE W REFLEX MICROSCOPIC - Abnormal; Notable for the following components:   Color, Urine YELLOW (*)    APPearance HAZY (*)    Hgb urine dipstick LARGE (*)    Protein, ur 30 (*)    All other components within normal limits  CBC WITH DIFFERENTIAL/PLATELET    RADIOLOGY CT renal protocol reviewed and interpreted by me with stone at the right UVJ and mild hydronephrosis on right.  PROCEDURES:  Critical Care performed: No  Procedures   MEDICATIONS ORDERED IN ED: Medications  oxyCODONE-acetaminophen (PERCOCET/ROXICET) 5-325 MG per tablet 1 tablet (1 tablet Oral Given 02/26/23 0127)  ondansetron (ZOFRAN-ODT) disintegrating tablet 4 mg (4 mg Oral Given 02/26/23 0127)  ondansetron (ZOFRAN)  injection 4 mg (4 mg Intravenous Given 02/26/23 0442)  ketorolac (TORADOL) 30 MG/ML injection 15 mg (15 mg Intravenous Given 02/26/23 0444)  sodium chloride 0.9 % bolus 1,000 mL (0 mLs Intravenous Stopped 02/26/23 0512)     IMPRESSION / MDM / ASSESSMENT AND PLAN / ED COURSE  I reviewed the triage vital signs and the nursing notes.                              33 y.o. male with past medical history of asthma and kidney stones who presents to the ED complaining of 2 weeks of waxing and waning pain in his right flank, acutely worse this evening.  Patient's presentation is most consistent with acute presentation with potential threat to life or bodily function.  Differential diagnosis includes, but is not limited to, kidney stone, pyelonephritis, cholecystitis, biliary  colic, anemia, electrolyte abnormality, AKI.  Patient uncomfortable but nontoxic-appearing and in no acute distress, vital signs are unremarkable.  CT renal protocol shows 6 mm stone at the right UVJ with associated hydronephrosis, no other acute finding noted.  Urinalysis with some hematuria but no evidence of infection.  Labs are reassuring with no significant anemia, leukocytosis, lecture abnormality, or AKI.  Pain initially improved with Percocet however patient now complains of increasing pain and nausea.  We will treat with IV Zofran and Toradol, hydrate with IV fluids and reassess.  Patient feeling better following Zofran and Toradol, he is tolerating oral intake without difficulty and is appropriate for discharge home with outpatient urology follow-up.  He was counseled to return to the ED for new or worsening symptoms.  Patient agrees with plan.      FINAL CLINICAL IMPRESSION(S) / ED DIAGNOSES   Final diagnoses:  Kidney stone  Right flank pain     Rx / DC Orders   ED Discharge Orders          Ordered    oxyCODONE-acetaminophen (PERCOCET) 5-325 MG tablet  Every 4 hours PRN        02/26/23 0516    ondansetron (ZOFRAN-ODT) 4 MG disintegrating tablet  Every 8 hours PRN        02/26/23 0516             Note:  This document was prepared using Dragon voice recognition software and may include unintentional dictation errors.   Chesley Noon, MD 02/26/23 [phone]

## 2023-03-01 ENCOUNTER — Ambulatory Visit (INDEPENDENT_AMBULATORY_CARE_PROVIDER_SITE_OTHER): Payer: 59 | Admitting: Urology

## 2023-03-01 ENCOUNTER — Encounter: Payer: Self-pay | Admitting: Urology

## 2023-03-01 VITALS — BP 118/78 | HR 81 | Ht 74.0 in | Wt 160.0 lb

## 2023-03-01 DIAGNOSIS — N201 Calculus of ureter: Secondary | ICD-10-CM

## 2023-03-01 DIAGNOSIS — N200 Calculus of kidney: Secondary | ICD-10-CM

## 2023-03-01 MED ORDER — ONDANSETRON HCL 4 MG PO TABS: 4.0000 mg | ORAL_TABLET | Freq: Three times a day (TID) | ORAL | 0 refills | Status: AC | PRN

## 2023-03-01 MED ORDER — TAMSULOSIN HCL 0.4 MG PO CAPS: 0.4000 mg | ORAL_CAPSULE | Freq: Every day | ORAL | 0 refills | Status: AC

## 2023-03-01 MED ORDER — KETOROLAC TROMETHAMINE 10 MG PO TABS: 10.0000 mg | ORAL_TABLET | Freq: Four times a day (QID) | ORAL | 0 refills | Status: AC | PRN

## 2023-03-01 NOTE — Patient Instructions (Signed)
Laser Therapy for Kidney Stones Laser therapy for kidney stones is a procedure to break up rock-like masses that form inside the kidneys (kidney stones). It is done using a device that beams a strong light (laser) on the kidney stones. This breaks the stones up into small pieces. These small pieces may leave your body when you pee (urinate) or may be taken out during the procedure.  You may need laser therapy if you have kidney stones that are painful or that are stopping you from being able to pee. Tell a health care provider about: Any allergies you have. All medicines you are taking, including vitamins, herbs, eye drops, creams, and over-the-counter medicines. Any problems you or family members have had with anesthesia. Any bleeding problems you have. Any surgeries you have had. Any medical conditions you have. Whether you are pregnant or may be pregnant. What are the risks? Your health care provider will talk with you about risks. These may include: Infection. Bleeding. Allergic reactions to medicines. Damage to: The part of your body that drains pee (urine) from the bladder (urethra). The bladder. The tube that connects the bladder to the kidneys (ureter). Urinary tract infection (UTI). Urethral stricture. This is when the urethra is narrowed by scarring. Trouble peeing. Blockage of the kidney. This may be caused by a piece of kidney stone. What happens before the procedure? When to stop eating and drinking Follow instructions from your provider about what you may eat and drink. These may include: 8 hours before the procedure Stop eating most foods. Do not eat meat, fried foods, or fatty foods. Eat only light foods, such as toast or crackers. All liquids are okay except energy drinks and alcohol. 6 hours before the procedure Stop eating. Drink only clear liquids, such as water, clear fruit juice, black coffee, plain tea, and sports drinks. Do not drink energy drinks or  alcohol. 2 hours before the procedure Stop drinking all liquids. You may be allowed to take medicines with small sips of water. If you do not follow your provider's instructions, your procedure may be delayed or canceled. Medicines Ask your provider about: Changing or stopping your regular medicines. These include any diabetes medicines or blood thinners you take. Taking medicines such as aspirin and ibuprofen. These medicines can thin your blood. Do not take them unless your provider tells you to. Taking over-the-counter medicines, vitamins, herbs, and supplements. Tests You may have a physical exam before the procedure. You may also have tests done. These may include: Imaging tests. Blood or pee tests. Surgery safety Ask your provider: How your surgery site will be marked. What steps will be taken to help prevent infection. These steps may include: Removing hair at the surgery site. Washing skin with a soap that kills germs. Taking antibiotics. General instructions Do not use any products that contain nicotine or tobacco for at least 4 weeks before the procedure. These products include cigarettes, chewing tobacco, and vaping devices, such as e-cigarettes. If you need help quitting, ask your provider. If you will be going home right after the procedure, plan to have a responsible adult: Take you home from the hospital or clinic. You will not be allowed to drive. Care for you for the time you are told. What happens during the procedure?  An IV will be inserted into one of your veins. You will be given: A sedative. This helps you relax. Anesthesia. This keeps you from feeling pain. It will make you fall asleep for surgery. A tool  with a camera on the end (ureteroscope) will be put into your urethra. It will be moved through your bladder to your kidney. It will send pictures to a screen in the operating room. This will show what parts of your kidney need to be treated. A tube will be  put through the ureteroscope. It will be moved into your kidney. The laser device will be put into your kidney through the tube. The laser will be used to break up the kidney stones. A tool with a tiny wire basket may be put through the tube into your kidney. This can help remove the small pieces of the kidney stone. A small mesh tube (stent) may be placed to allow your kidney to drain. The tube and ureteroscope will be taken out at the end of the surgery. The procedure may vary among providers and hospitals. What happens after the procedure? Your blood pressure, heart rate, breathing rate, and blood oxygen level will be monitored until you leave the hospital or clinic. If you had a stent placed, it may have a string that will be secured to your skin. This helps your provider remove the stent. You may be given a strainer to collect any stone pieces that you pass in your pee. Your provider may have these tested. This information is not intended to replace advice given to you by your health care provider. Make sure you discuss any questions you have with your health care provider. Document Revised: 09/23/2021 Document Reviewed: 09/23/2021 Elsevier Patient Education  2024 [employer].

## 2023-03-01 NOTE — Progress Notes (Signed)
   03/01/23 4:25 PM   Marcellina Millin 05/30/1990 [id number]  CC: Right distal ureteral stone  HPI: 33 year old male who presented to the ER on 02/26/2023 with right-sided flank pain and nausea and CT showed a 6 mm right distal ureteral stone with upstream hydronephrosis.  He was discharged with pain meds and urology follow-up.  Pain is overall been well-controlled, but he has had some persistent nausea.  He denies any urinary symptoms or fevers or chills.   PMH: Past Medical History:  Diagnosis Date   Allergy    Asthma    Bee sting reaction    Kidney stone     Surgical History: Past Surgical History:  Procedure Laterality Date   WISDOM TOOTH EXTRACTION Bilateral 2013      Family History: Family History  Problem Relation Age of Onset   Allergies Father    Allergies Sister    Arthritis Maternal Grandmother    Arthritis Paternal Grandmother     Social History:  reports that he has never smoked. He has never been exposed to tobacco smoke. He has never used smokeless tobacco. He reports current alcohol use. He reports that he does not use drugs.  Physical Exam: BP 118/78   Pulse 81   Ht 6\' 2"  (1.88 m)   Wt 160 lb (72.6 kg)   BMI 20.54 kg/m    Constitutional:  Alert and oriented, No acute distress. Cardiovascular: No clubbing, cyanosis, or edema. Respiratory: Normal respiratory effort, no increased work of breathing. GI: Abdomen is soft, nontender, nondistended, no abdominal masses   Laboratory Data: Reviewed  Pertinent Imaging: I have personally viewed and interpreted the CT scan showing a 6 mm right distal ureteral stone right at the UVJ.  Assessment & Plan:   33 year old male with 6 mm right distal ureteral stone, no clinical or laboratory evidence of infection.  Pain currently well-controlled but persistent nausea.  We discussed various treatment options for urolithiasis including observation with or without medical expulsive therapy, shockwave lithotripsy  (SWL), ureteroscopy and laser lithotripsy with stent placement, and percutaneous nephrolithotomy.We discussed that management is based on stone size, location, density, patient co-morbidities, and patient preference. Stones <77mm in size have a >80% spontaneous passage rate. Data surrounding the use of tamsulosin for medical expulsive therapy is controversial, but meta analyses suggests it is most efficacious for distal stones between 5-49mm in size. Possible side effects include dizziness/lightheadedness, and retrograde ejaculation.SWL has a lower stone free rate in a single procedure, but also a lower complication rate compared to ureteroscopy and avoids a stent and associated stent related symptoms. Possible complications include renal hematoma, steinstrasse, and need for additional treatment.Ureteroscopy with laser lithotripsy and stent placement has a higher stone free rate than SWL in a single procedure, however increased complication rate including possible infection, ureteral injury, bleeding, and stent related morbidity. Common stent related symptoms include dysuria, urgency/frequency, and flank pain.  After an extensive discussion of the risks and benefits of the above treatment options, the patient would like to proceed with medical expulsive therapy and trial of Flomax, I also offered ureteroscopy but he deferred.  Okay to schedule ureteroscopy if worsening pain, he prefers to contact us in the next 1 to 2 weeks if he does not pass the stone  -Toradol, Zofran, Flomax sent in  Legrand Rams, MD 03/01/2023  [facility] [address], [address] [place], [place] [address] [phone]
